# Patient Record
Sex: Female | Born: 1980 | Race: White | Hispanic: No | Marital: Married | Smoking: Former smoker
Health system: Southern US, Community
[De-identification: ages and names within clinical notes are randomized; demographics above are authoritative.]

## PROBLEM LIST (undated history)

## (undated) DIAGNOSIS — M1990 Unspecified osteoarthritis, unspecified site: Secondary | ICD-10-CM

## (undated) DIAGNOSIS — E8881 Metabolic syndrome: Secondary | ICD-10-CM

## (undated) DIAGNOSIS — R87619 Unspecified abnormal cytological findings in specimens from cervix uteri: Secondary | ICD-10-CM

## (undated) DIAGNOSIS — IMO0002 Reserved for concepts with insufficient information to code with codable children: Secondary | ICD-10-CM

## (undated) DIAGNOSIS — M797 Fibromyalgia: Secondary | ICD-10-CM

## (undated) DIAGNOSIS — E282 Polycystic ovarian syndrome: Secondary | ICD-10-CM

## (undated) DIAGNOSIS — D649 Anemia, unspecified: Secondary | ICD-10-CM

## (undated) DIAGNOSIS — M069 Rheumatoid arthritis, unspecified: Secondary | ICD-10-CM

## (undated) DIAGNOSIS — R51 Headache: Secondary | ICD-10-CM

## (undated) DIAGNOSIS — E039 Hypothyroidism, unspecified: Secondary | ICD-10-CM

## (undated) HISTORY — PX: WISDOM TOOTH EXTRACTION: SHX21

## (undated) HISTORY — DX: Fibromyalgia: M79.7

## (undated) HISTORY — DX: Rheumatoid arthritis, unspecified: M06.9

## (undated) HISTORY — PX: APPENDECTOMY: SHX54

## (undated) HISTORY — DX: Polycystic ovarian syndrome: E28.2

## (undated) HISTORY — DX: Unspecified abnormal cytological findings in specimens from cervix uteri: R87.619

## (undated) HISTORY — DX: Metabolic syndrome: E88.81

## (undated) HISTORY — DX: Reserved for concepts with insufficient information to code with codable children: IMO0002

## (undated) HISTORY — DX: Headache: R51

## (undated) HISTORY — DX: Anemia, unspecified: D64.9

## (undated) HISTORY — DX: Hypothyroidism, unspecified: E03.9

## (undated) HISTORY — DX: Unspecified osteoarthritis, unspecified site: M19.90

## (undated) HISTORY — PX: TONSILLECTOMY: SUR1361

---

## 2003-03-10 ENCOUNTER — Other Ambulatory Visit: Admission: RE | Admit: 2003-03-10 | Discharge: 2003-03-10 | Payer: Self-pay | Admitting: Gynecology

## 2004-08-05 ENCOUNTER — Emergency Department (HOSPITAL_COMMUNITY): Admission: EM | Admit: 2004-08-05 | Discharge: 2004-08-05 | Payer: Self-pay | Admitting: Emergency Medicine

## 2005-02-17 ENCOUNTER — Inpatient Hospital Stay (HOSPITAL_COMMUNITY): Admission: EM | Admit: 2005-02-17 | Discharge: 2005-02-19 | Payer: Self-pay | Admitting: Emergency Medicine

## 2005-02-17 ENCOUNTER — Encounter (INDEPENDENT_AMBULATORY_CARE_PROVIDER_SITE_OTHER): Payer: Self-pay | Admitting: General Surgery

## 2005-03-27 ENCOUNTER — Other Ambulatory Visit: Admission: RE | Admit: 2005-03-27 | Discharge: 2005-03-27 | Payer: Self-pay | Admitting: Gynecology

## 2005-11-09 ENCOUNTER — Other Ambulatory Visit: Admission: RE | Admit: 2005-11-09 | Discharge: 2005-11-09 | Payer: Self-pay | Admitting: Gynecology

## 2006-04-09 ENCOUNTER — Other Ambulatory Visit: Admission: RE | Admit: 2006-04-09 | Discharge: 2006-04-09 | Payer: Self-pay | Admitting: Gynecology

## 2006-08-07 ENCOUNTER — Emergency Department (HOSPITAL_COMMUNITY): Admission: EM | Admit: 2006-08-07 | Discharge: 2006-08-07 | Payer: Self-pay | Admitting: Emergency Medicine

## 2006-12-04 ENCOUNTER — Ambulatory Visit (HOSPITAL_COMMUNITY): Admission: RE | Admit: 2006-12-04 | Discharge: 2006-12-04 | Payer: Self-pay | Admitting: Family Medicine

## 2007-05-12 ENCOUNTER — Ambulatory Visit (HOSPITAL_COMMUNITY): Admission: RE | Admit: 2007-05-12 | Discharge: 2007-05-12 | Payer: Self-pay | Admitting: Orthopedic Surgery

## 2007-08-04 ENCOUNTER — Emergency Department (HOSPITAL_COMMUNITY): Admission: EM | Admit: 2007-08-04 | Discharge: 2007-08-04 | Payer: Self-pay | Admitting: Emergency Medicine

## 2008-01-23 DIAGNOSIS — E039 Hypothyroidism, unspecified: Secondary | ICD-10-CM

## 2008-01-23 HISTORY — DX: Hypothyroidism, unspecified: E03.9

## 2008-09-15 ENCOUNTER — Ambulatory Visit (HOSPITAL_COMMUNITY): Admission: RE | Admit: 2008-09-15 | Discharge: 2008-09-15 | Payer: Self-pay | Admitting: Family Medicine

## 2009-10-01 ENCOUNTER — Emergency Department (HOSPITAL_COMMUNITY)
Admission: EM | Admit: 2009-10-01 | Discharge: 2009-10-01 | Payer: Self-pay | Source: Home / Self Care | Admitting: Emergency Medicine

## 2009-12-14 ENCOUNTER — Observation Stay: Payer: Self-pay | Admitting: Obstetrics and Gynecology

## 2010-03-27 ENCOUNTER — Inpatient Hospital Stay (HOSPITAL_COMMUNITY): Admission: AD | Admit: 2010-03-27 | Payer: Self-pay | Admitting: Obstetrics and Gynecology

## 2010-04-06 ENCOUNTER — Inpatient Hospital Stay (HOSPITAL_COMMUNITY)
Admission: AD | Admit: 2010-04-06 | Discharge: 2010-04-08 | DRG: 775 | Disposition: A | Discharge status: Home / Self Care | Payer: PRIVATE HEALTH INSURANCE | Source: Ambulatory Visit | Attending: Obstetrics and Gynecology | Admitting: Obstetrics and Gynecology

## 2010-04-06 ENCOUNTER — Inpatient Hospital Stay (HOSPITAL_COMMUNITY)
Admission: AD | Admit: 2010-04-06 | Discharge: 2010-04-06 | Disposition: A | Discharge status: Home / Self Care | Payer: PRIVATE HEALTH INSURANCE | Source: Ambulatory Visit | Attending: Obstetrics and Gynecology | Admitting: Obstetrics and Gynecology

## 2010-04-06 DIAGNOSIS — O99284 Endocrine, nutritional and metabolic diseases complicating childbirth: Secondary | ICD-10-CM | POA: Diagnosis present

## 2010-04-06 DIAGNOSIS — E0590 Thyrotoxicosis, unspecified without thyrotoxic crisis or storm: Secondary | ICD-10-CM | POA: Diagnosis present

## 2010-04-06 DIAGNOSIS — E079 Disorder of thyroid, unspecified: Secondary | ICD-10-CM | POA: Diagnosis present

## 2010-04-06 DIAGNOSIS — O479 False labor, unspecified: Secondary | ICD-10-CM | POA: Insufficient documentation

## 2010-04-06 LAB — DIFFERENTIAL
Basophils Relative: 0 % (ref 0–1)
Monocytes Absolute: 0.5 10*3/uL (ref 0.1–1.0)
Neutrophils Relative %: 74 % (ref 43–77)

## 2010-04-06 LAB — BASIC METABOLIC PANEL
Calcium: 9.1 mg/dL (ref 8.4–10.5)
Chloride: 106 mEq/L (ref 96–112)
Creatinine, Ser: 0.54 mg/dL (ref 0.4–1.2)
Glucose, Bld: 90 mg/dL (ref 70–99)
Sodium: 135 mEq/L (ref 135–145)

## 2010-04-06 LAB — URINALYSIS, ROUTINE W REFLEX MICROSCOPIC
Hgb urine dipstick: NEGATIVE
Nitrite: NEGATIVE
Protein, ur: NEGATIVE mg/dL
Specific Gravity, Urine: 1.015 (ref 1.005–1.030)

## 2010-04-06 LAB — CBC
HCT: 34.2 % — ABNORMAL LOW (ref 36.0–46.0)
HCT: 38.4 % (ref 36.0–46.0)
Hemoglobin: 13 g/dL (ref 12.0–15.0)
MCH: 32.2 pg (ref 26.0–34.0)
MCHC: 34.2 g/dL (ref 30.0–36.0)
MCHC: 33.9 g/dL (ref 30.0–36.0)
MCV: 91.4 fL (ref 78.0–100.0)
Platelets: 184 10*3/uL (ref 150–400)
Platelets: 183 10*3/uL (ref 150–400)
RDW: 12.9 % (ref 11.5–15.5)
WBC: 6 10*3/uL (ref 4.0–10.5)

## 2010-04-07 LAB — RPR: RPR Ser Ql: NONREACTIVE

## 2010-04-08 LAB — CBC
MCV: 92.2 fL (ref 78.0–100.0)
Platelets: 171 10*3/uL (ref 150–400)
RBC: 3.7 MIL/uL — ABNORMAL LOW (ref 3.87–5.11)
WBC: 10.9 10*3/uL — ABNORMAL HIGH (ref 4.0–10.5)

## 2010-04-14 NOTE — Discharge Summary (Signed)
  NAME:  Elizabeth Moon, Elizabeth Moon NO.:  [id number]  MEDICAL RECORD NO.:  [id number]           PATIENT TYPE:  I  LOCATION:  9107                          FACILITY:  [facility]  PHYSICIAN:  Huel Cote, M.D. DATE OF BIRTH:  1980-07-10  DATE OF ADMISSION:  04/06/2010 DATE OF DISCHARGE:  04/08/2010                              DISCHARGE SUMMARY   DISCHARGE DIAGNOSES: 1. Term pregnancy at 40-6/7 weeks delivered. 2. Status post normal spontaneous vaginal delivery.  DISCHARGE MEDICATIONS: 1. Motrin 600 mg p.o. every 6 hours. 2. Percocet 1-2 tablets p.o. every 4 hours p.r.n.  DISCHARGE FOLLOWUP:  The patient is to follow up in the office in 6 weeks for her full postpartum exam.  HOSPITAL COURSE:  The patient is a 30 year old G1 P0 who was admitted at 40-6/7 weeks in early labor.  Pregnancy had been complicated by fibromyalgia, hyperthyroidism, polycystic ovary syndrome, all of which were well controlled in pregnancy.  PAST OBSTETRICAL HISTORY:  None.  PAST GYN HISTORY:  She had a remote history of abnormal Pap smear, which resolved on followup.  PAST MEDICAL HISTORY:  Significant for migraines, fibromyalgia, hypothyroidism, and insulin resistance with PCOS, arthritis, and bone spurs as well.  PAST SURGICAL HISTORY:  Significant for appendectomy, wisdom teeth extraction.  MEDICATIONS:  [place] Thyroid and metformin.  ALLERGIES:  LATEX sensitivity.  No known drug allergies.  She is married and has no tobacco, alcohol, or drug use.  Prenatal labs are as follows, A+ antibody negative, RPR nonreactive, rubella immune, hepatitis B surface antigen negative, HIV negative, GC negative, Chlamydia negative, genetic screens were declined, 1-hour Glucola was normal, and group B strep was negative.  The patient was admitted in labor and was found to be 4-5 cm dilated, 90% effaced, she had rupture of membranes performed, and Pitocin augmentation, and progressed well to complete  dilation after receiving an epidural anesthesia.  She then pushed well with a normal spontaneous vaginal delivery of a viable female infant, Apgars were 9 and 9, weight was 7 pounds 13 ounces.  The baby delivered through a nuchal cord and was LOP.  First-degree perineal laceration was repaired with 3-0 Vicryl Rapide and bilateral labial abrasions were hemostatic.  She was admitted for routine postpartum care.  On postpartum day #1, she was doing quite well and requested early discharge.  Her discharge hemoglobin was 11.4, and she was given instructions on pelvic rest and birth control and will follow up in the office in 6 weeks.     Huel Cote, M.D.     KR/MEDQ  D:  04/08/2010  T:  04/09/2010  Job:  [id number]  Electronically Signed by Huel Cote M.D. on 04/14/2010 09:07:23 AM

## 2010-06-09 NOTE — Consult Note (Signed)
Elizabeth Moon, Elizabeth Moon            ACCOUNT NO.:  [id number]   MEDICAL RECORD NO.:  [id number]          PATIENT TYPE:  INP   LOCATION:  A331                          FACILITY:  [facility]   PHYSICIAN:  Barbaraann Barthel, M.D. DATE OF BIRTH:  1981/01/06   DATE OF CONSULTATION:  02/17/2005  DATE OF DISCHARGE:                                   CONSULTATION   REASON FOR CONSULTATION:  Surgery was asked to see this 30 year old, white  female with right lower quadrant pain.   CHIEF COMPLAINT:  Right lower quadrant pain, nausea and vomiting.   HISTORY OF PRESENT ILLNESS:  The patient states that at approximately 2 a.m.  on February 16, 2005, she developed some sharp epigastric pain that later  localized to the right lower quadrant and was then followed with nausea and  vomiting.  She came to the emergency room around 11 p.m.  She was seen and  examined and a CT scan was performed showing the likelihood of appendicitis.  Surgery was consulted about 2:30 a.m. and we admitted her and hydrated her  with plans for surgery the following morning.   PHYSICAL EXAMINATION:  GENERAL:  This is a pleasant, 30 year old, white  female in no acute distress.  VITAL SIGNS:  Height 5 feet 7 inches, weight 158 pounds.  Temperature 98.1,  blood pressure 99/54, heart rate 64 per minute, respirations 18.  HEENT:  Head is normocephalic.  Extraocular movements intact.  Pupils equal  round and reactive to light and accommodation.  There is no conjunctive  pallor or scleral injection.  There are no bruits auscultated and no  cervical adenopathy.  CHEST:  Clear to posterior and anterior auscultation.  HEART:  Regular rate and rhythm.  BREASTS:  Axilla without masses.  ABDOMEN:  Tender in the right lower quadrant with guarding.  Bowel sounds  are a bit diminished.  There are no femoral or inguinal hernias appreciated.  RECTAL:  Guaiac negative stool.  PELVIC:  Deferred.  EXTREMITIES:  Within normal limits.   PAST  MEDICAL HISTORY:  History of migraines.   MEDICATIONS:  Medication for migraines and birth control pills.   ALLERGIES:  No known drug allergies.   PAST SURGICAL HISTORY:  None.   REVIEW OF SYSTEMS:  GASTROINTESTINAL:  Nausea and vomiting with right lower  quadrant pain.  No change in bowel habits.  No bright red rectal bleeding,  black tarry stools or unexplained weight loss.  No past history of  hepatitis, although she has a grandmother who has had liver cancer and a  mother who has been diagnosed with sclerosing cholangitis.  GENITOURINARY:  No dysuria, no history of kidney stones.  OB/GYN:  The patient's last  menstrual period was 2 months ago and she is a G0, P0, AB 0, cesarean 0  female.  She takes birth control pills.  NEUROLOGIC:  The patient has a  history of migraines.  CARDIORESPIRATORY:  Within normal limits.  The  patient has no cardiovascular problems.  She is a nonsmoker.  MUSCULOSKELETAL:  Within normal limits.   LABORATORY DATA AND X-RAY FINDINGS:  The patient has a  white count last  night of 17,000 with an H&H of 13.4 and 39.1 with 87 neutrophils noted.  Electrolytes show a mild hyponatremia at 131 and mild hypokalemia at 3.4,  BUN 10, creatinine 0.8.  Liver function studies are within normal limits.  Lipase not elevated.  Urine is hyperconcentrated, but otherwise negative for  blood, protein or glucose.  Her urine pregnancy test is negative.   CT scan was done last night.  The results show that change is suggestive of  acute appendicitis.  No abscess or perforation is appreciated.   IMPRESSION:  This is likely acute appendicitis.  She has been hydrated and  antibiotics have been initiated in the emergency room.   RECOMMENDATIONS:  We will plan for surgery as soon as possible.  We  discussed complications, not limited to, but including bleeding, infection  and appendiceal stump leakage and informed consent was obtained.      Barbaraann Barthel, M.D.   Electronically Signed     WB/MEDQ  D:  02/17/2005  T:  02/17/2005  Job:  [id number]

## 2010-06-09 NOTE — Discharge Summary (Signed)
Elizabeth Moon, Elizabeth Moon            ACCOUNT NO.:  [id number]   MEDICAL RECORD NO.:  [id number]          PATIENT TYPE:  INP   LOCATION:  A331                          FACILITY:  [facility]   PHYSICIAN:  Barbaraann Barthel, M.D. DATE OF BIRTH:  [birth year]   DATE OF ADMISSION:  02/16/2005  DATE OF DISCHARGE:  01/29/2007LH                                 DISCHARGE SUMMARY   DIAGNOSIS:  Acute appendicitis.   PROCEDURE:  On February 17, 2005, open appendectomy.   NOTE:  This is a 30 year old white female who was admitted for acute  appendicitis via the emergency room. She underwent an open appendectomy  uneventfully on February 17, 2005. She did well postoperatively. Her hospital  course was completely uneventful. Her diet and activity were advanced as  tolerated. She was discharged on the second postoperative day at which time  she was voiding well. She had no leg pain or shortness of breath, and her  wound was clean without any evidence of infection. She did quite well, and  she was discharged on the second postoperative day.   LABORATORY DATA:  Revealed acute appendicitis and site of endometriosis at  the distal tip with secondary fibrosis at that point noted. The rest of her  laboratory work, she was discharged with a white count 5.8. She had an  admitting white count of 17.4. She was discharged with a white count of 5.8  with H&H of 11.0 and 31.7. Her electrolytes were grossly within normal  limits. Her liver function studies were also not elevated. She had a  negative urine pregnancy test. A urinalysis was grossly within normal  limits. CT scan revealed evidence for acute appendicitis without  perforation. This was confirmed intraoperatively.   DISCHARGE INSTRUCTIONS:  Discharge instructions include that she is excused  from work. She is discharged on a soft diet. She is to increase her activity  as tolerated. She is permitted to shower, permitted to walk up and down the  stairs. She is to  refrain from doing any heavy lifting, sexual activity or  driving for immediate postoperative period.   She is told to clean her wound with alcohol three times a day. She is given  a prescription for Darvocet-N 100 one tablet every 4 hours as needed for  pain, and she is to told to take no Ex-Lax or other harsh cathartics in  postoperative period. I made arrangements follow-up with her on Thursday,  February 1 at 2 o'clock p.m., and I have told her to contact me or come the  emergency room should there be any acute changes.   Please add on the dictation that this is the second discharge summary  dictated on this patient.     Barbaraann Barthel, M.D.  Electronically Signed    WB/MEDQ  D:  03/05/2005  T:  03/06/2005  Job:  [id number]

## 2010-06-09 NOTE — Op Note (Signed)
NAMEYUDIT, MODESITT            ACCOUNT NO.:  [id number]   MEDICAL RECORD NO.:  [id number]          PATIENT TYPE:  INP   LOCATION:  A331                          FACILITY:  [facility]   PHYSICIAN:  Barbaraann Barthel, M.D. DATE OF BIRTH:  August 15, 1980   DATE OF PROCEDURE:  03/05/2005  DATE OF DISCHARGE:  02/19/2005                                 OPERATIVE REPORT   Please add that this is a second operative note dictated as well as a second  discharge summary dictated. I will redictate an operative note and a  discharge summary as apparently they were lost in the system.   SURGEON:  Dr. Malvin Johns.   PREOPERATIVE DIAGNOSIS:  Acute appendicitis.   PROCEDURE:  Open appendectomy.   SPECIMEN:  Appendix.   NOTE:  This is a 30 year old female who was admitted early in the morning of  February 17, 2005 for acute appendicitis as seen on a CT scan. She was taken  to surgery after hydration and antibiotics were initiated at which time an  acute, suppurative, nonperforated appendix was found. An open appendectomy  was performed uneventfully.   GROSS OPERATIVE FINDINGS:  Those consistent with acute, supportive,  nonperforated appendix.   TECHNIQUE:  The patient was placed in supine position. After the adequate  administration of general anesthesia via endotracheal intubation, entire was  prepped Betadine solution and draped in usual manner. A Foley catheter was  placed aseptically. A low transverse incision was carried out through skin,  subcutaneous tissue through the peritoneal cavity, and the right lower  quadrant was explored. Normal terminal ileum. No other abnormalities were  encountered. The appendix was acutely inflamed. This was delivered into the  wound. The mesoappendix was ligated with 2-0 silk and amputated with a TA-30  stapling device. The right lower quadrant was then irrigated. We changed  gloves. The peritoneum was closed with a running 0 Polysorb suture, and  figure-of-eight 0  Polysorb sutures were used to close the fascia. Subcu was  irrigated. Skin was closed with a stapling device. Prior to closure, all  sponge, needle and instrument counts were found to be correct. Estimated  blood loss was minimal. The patient received 1,300 cc of crystalloids  intraoperatively. No drains were placed. There were no complications.      Barbaraann Barthel, M.D.  Electronically Signed     WB/MEDQ  D:  03/05/2005  T:  03/06/2005  Job:  [id number]

## 2010-07-28 ENCOUNTER — Other Ambulatory Visit (HOSPITAL_COMMUNITY): Payer: Self-pay | Admitting: Orthopedic Surgery

## 2010-07-28 DIAGNOSIS — M542 Cervicalgia: Secondary | ICD-10-CM

## 2010-08-01 ENCOUNTER — Other Ambulatory Visit (HOSPITAL_COMMUNITY): Payer: PRIVATE HEALTH INSURANCE

## 2010-08-02 ENCOUNTER — Ambulatory Visit: Payer: Self-pay | Admitting: Orthopedic Surgery

## 2012-01-23 NOTE — L&D Delivery Note (Signed)
Delivery Note At 8:36 PM a healthy female was delivered via Vaginal, Spontaneous Delivery (Presentation: Right Occiput Posterior).  APGAR:9 ,9 ; weight pending.   Placenta status: Intact, Spontaneous.  Cord: 3 vessels with the following complications: None.   Anesthesia: Epidural  Episiotomy: None Lacerations: none Suture Repair:n/a Est. Blood Loss (mL): 300cc  Mom to postpartum.  Baby to stay with mom. Oliver Pila 09/11/2012, 8:55 PM

## 2012-03-03 LAB — OB RESULTS CONSOLE RUBELLA ANTIBODY, IGM: Rubella: IMMUNE

## 2012-03-03 LAB — OB RESULTS CONSOLE HIV ANTIBODY (ROUTINE TESTING): HIV: NONREACTIVE

## 2012-03-03 LAB — OB RESULTS CONSOLE GC/CHLAMYDIA: Chlamydia: NEGATIVE

## 2012-03-03 LAB — OB RESULTS CONSOLE ABO/RH

## 2012-03-03 LAB — OB RESULTS CONSOLE ANTIBODY SCREEN: Antibody Screen: NEGATIVE

## 2012-03-03 LAB — OB RESULTS CONSOLE HEPATITIS B SURFACE ANTIGEN: Hepatitis B Surface Ag: NEGATIVE

## 2012-03-03 LAB — OB RESULTS CONSOLE RPR: RPR: NONREACTIVE

## 2012-09-02 ENCOUNTER — Telehealth (HOSPITAL_COMMUNITY): Payer: Self-pay | Admitting: *Deleted

## 2012-09-02 ENCOUNTER — Encounter (HOSPITAL_COMMUNITY): Payer: Self-pay | Admitting: *Deleted

## 2012-09-02 NOTE — Telephone Encounter (Signed)
Preadmission screen  

## 2012-09-10 NOTE — H&P (Signed)
Elizabeth Moon is a 32 y.o. female presenting for G3P1011 at 39+ weeks (EDD 09/16/12 by 8 week [place]) presents for IOL at term.  Prenatal care relatively uncomplicated.  Pt has a history of PCOS and insulin resistance and is managed with metformin and no issues with gestational diabetes. Hypothyroid but levels are stable.  Maternal Medical History:  Contractions: Frequency: irregular.   Perceived severity is mild.    Fetal activity: Perceived fetal activity is normal.    Prenatal Complications - Diabetes: none.    OB History   Grav Para Term Preterm Abortions TAB SAB Ect Mult Living   3 1 1  1  1   1     NSVD 2012 7#13oz SAB x 1  Past Medical History  Diagnosis Date  . Abnormal Pap smear   . Anemia   . Arthritis   . Headache(784.0)   . Fibromyalgia   . Hypothyroidism   . PCOS (polycystic ovarian syndrome)   . Insulin resistance    Past Surgical History  Procedure Laterality Date  . Wisdom tooth extraction    . Appendectomy     Family History: family history includes Alzheimer's disease in her maternal grandmother; Cancer in her maternal grandmother; Depression in her maternal aunt, maternal grandmother, and mother; Diabetes in her paternal grandfather and paternal grandmother; Stroke in her maternal grandmother and paternal grandfather. Social History:  reports that she has never smoked. She has never used smokeless tobacco. She reports that she does not drink alcohol or use illicit drugs.   Prenatal Transfer Tool  Maternal Diabetes: No Genetic Screening: Declined Maternal Ultrasounds/Referrals: Normal Fetal Ultrasounds or other Referrals:  None Maternal Substance Abuse:  No Significant Maternal Medications:  Meds include: Other: Metformin Significant Maternal Lab Results:  None Other Comments:  None  ROS    Last menstrual period 12/04/2011. Maternal Exam:  Uterine Assessment: Contraction strength is mild.  Contraction frequency is irregular.   Abdomen: Patient  reports no abdominal tenderness. Fetal presentation: vertex  Introitus: Normal vulva. Normal vagina.    Physical Exam  Constitutional: She is oriented to person, place, and time. She appears well-developed and well-nourished.  Cardiovascular: Normal rate and regular rhythm.   Respiratory: Effort normal and breath sounds normal.  GI: Soft. Bowel sounds are normal.  Genitourinary: Vagina normal and uterus normal.  Neurological: She is alert and oriented to person, place, and time.  Psychiatric: She has a normal mood and affect.    Prenatal labs: ABO, Rh: A/Positive/-- (02/10 0000) Antibody: Negative (02/10 0000) Rubella: Immune (02/10 0000) RPR: Nonreactive (02/10 0000)  HBsAg: Negative (02/10 0000)  HIV: Non-reactive (02/10 0000)  GBS:   negative One hour GTT 95 Declined genetic screen GC/Chlam neg  Assessment/Plan: Pt for IOL at term.  Pitocin and SROM planned.   Oliver Pila 09/10/2012, 11:15 AM

## 2012-09-11 ENCOUNTER — Encounter (HOSPITAL_COMMUNITY): Payer: Self-pay | Admitting: Anesthesiology

## 2012-09-11 ENCOUNTER — Inpatient Hospital Stay (HOSPITAL_COMMUNITY): Payer: 59 | Admitting: Anesthesiology

## 2012-09-11 ENCOUNTER — Inpatient Hospital Stay (HOSPITAL_COMMUNITY)
Admission: RE | Admit: 2012-09-11 | Discharge: 2012-09-13 | DRG: 775 | Disposition: A | Discharge status: Home / Self Care | Payer: 59 | Source: Ambulatory Visit | Attending: Obstetrics and Gynecology | Admitting: Obstetrics and Gynecology

## 2012-09-11 ENCOUNTER — Encounter (HOSPITAL_COMMUNITY): Payer: Self-pay

## 2012-09-11 DIAGNOSIS — E079 Disorder of thyroid, unspecified: Principal | ICD-10-CM | POA: Diagnosis present

## 2012-09-11 DIAGNOSIS — E039 Hypothyroidism, unspecified: Secondary | ICD-10-CM | POA: Diagnosis present

## 2012-09-11 LAB — CBC
HCT: 35.1 % — ABNORMAL LOW (ref 36.0–46.0)
Hemoglobin: 12.1 g/dL (ref 12.0–15.0)
MCV: 90.5 fL (ref 78.0–100.0)
Platelets: 163 10*3/uL (ref 150–400)
RBC: 3.88 MIL/uL (ref 3.87–5.11)
WBC: 7.3 10*3/uL (ref 4.0–10.5)

## 2012-09-11 MED ORDER — IBUPROFEN 600 MG PO TABS
600.0000 mg | ORAL_TABLET | Freq: Four times a day (QID) | ORAL | Status: DC
  Administered 2012-09-12 – 2012-09-13 (×6): 600 mg via ORAL
  Filled 2012-09-11 (×5): qty 1

## 2012-09-11 MED ORDER — SENNOSIDES-DOCUSATE SODIUM 8.6-50 MG PO TABS
2.0000 | ORAL_TABLET | Freq: Every day | ORAL | Status: DC
  Administered 2012-09-12: 2 via ORAL

## 2012-09-11 MED ORDER — LIDOCAINE HCL (PF) 1 % IJ SOLN
INTRAMUSCULAR | Status: DC | PRN
  Administered 2012-09-11 (×2): 9 mL

## 2012-09-11 MED ORDER — OXYCODONE-ACETAMINOPHEN 5-325 MG PO TABS: 1.0000 | ORAL_TABLET | ORAL | Status: DC | PRN

## 2012-09-11 MED ORDER — LACTATED RINGERS IV SOLN: 500.0000 mL | INTRAVENOUS | Status: DC | PRN

## 2012-09-11 MED ORDER — DIPHENHYDRAMINE HCL 50 MG/ML IJ SOLN: 12.5000 mg | INTRAMUSCULAR | Status: DC | PRN

## 2012-09-11 MED ORDER — EPHEDRINE 5 MG/ML INJ
10.0000 mg | INTRAVENOUS | Status: DC | PRN
  Filled 2012-09-11: qty 2

## 2012-09-11 MED ORDER — WITCH HAZEL-GLYCERIN EX PADS: 1.0000 "application " | MEDICATED_PAD | CUTANEOUS | Status: DC | PRN

## 2012-09-11 MED ORDER — ONDANSETRON HCL 4 MG/2ML IJ SOLN
4.0000 mg | Freq: Four times a day (QID) | INTRAMUSCULAR | Status: DC | PRN
  Administered 2012-09-11: 4 mg via INTRAVENOUS
  Filled 2012-09-11: qty 2

## 2012-09-11 MED ORDER — ZOLPIDEM TARTRATE 5 MG PO TABS: 5.0000 mg | ORAL_TABLET | Freq: Every evening | ORAL | Status: DC | PRN

## 2012-09-11 MED ORDER — FENTANYL 2.5 MCG/ML BUPIVACAINE 1/10 % EPIDURAL INFUSION (WH - ANES)
INTRAMUSCULAR | Status: DC | PRN
  Administered 2012-09-11: 14 mL/h via EPIDURAL

## 2012-09-11 MED ORDER — BENZOCAINE-MENTHOL 20-0.5 % EX AERO
1.0000 "application " | INHALATION_SPRAY | CUTANEOUS | Status: DC | PRN
  Administered 2012-09-12: 1 via TOPICAL
  Filled 2012-09-11: qty 56

## 2012-09-11 MED ORDER — PHENYLEPHRINE 40 MCG/ML (10ML) SYRINGE FOR IV PUSH (FOR BLOOD PRESSURE SUPPORT)
80.0000 ug | PREFILLED_SYRINGE | INTRAVENOUS | Status: DC | PRN
  Filled 2012-09-11: qty 5
  Filled 2012-09-11: qty 2

## 2012-09-11 MED ORDER — LACTATED RINGERS IV SOLN
INTRAVENOUS | Status: DC
  Administered 2012-09-11 (×2): 1000 mL via INTRAVENOUS

## 2012-09-11 MED ORDER — LANOLIN HYDROUS EX OINT: TOPICAL_OINTMENT | CUTANEOUS | Status: DC | PRN

## 2012-09-11 MED ORDER — ACETAMINOPHEN 325 MG PO TABS
650.0000 mg | ORAL_TABLET | ORAL | Status: DC | PRN
  Administered 2012-09-11: 650 mg via ORAL
  Filled 2012-09-11: qty 2

## 2012-09-11 MED ORDER — OXYTOCIN 40 UNITS IN LACTATED RINGERS INFUSION - SIMPLE MED: 62.5000 mL/h | INTRAVENOUS | Status: DC

## 2012-09-11 MED ORDER — TERBUTALINE SULFATE 1 MG/ML IJ SOLN: 0.2500 mg | Freq: Once | INTRAMUSCULAR | Status: DC | PRN

## 2012-09-11 MED ORDER — IBUPROFEN 600 MG PO TABS: 600.0000 mg | ORAL_TABLET | Freq: Four times a day (QID) | ORAL | Status: DC | PRN

## 2012-09-11 MED ORDER — LACTATED RINGERS IV SOLN: 500.0000 mL | Freq: Once | INTRAVENOUS | Status: DC

## 2012-09-11 MED ORDER — EPHEDRINE 5 MG/ML INJ
10.0000 mg | INTRAVENOUS | Status: DC | PRN
  Filled 2012-09-11: qty 4
  Filled 2012-09-11: qty 2

## 2012-09-11 MED ORDER — TETANUS-DIPHTH-ACELL PERTUSSIS 5-2.5-18.5 LF-MCG/0.5 IM SUSP: 0.5000 mL | Freq: Once | INTRAMUSCULAR | Status: DC

## 2012-09-11 MED ORDER — OXYCODONE-ACETAMINOPHEN 5-325 MG PO TABS
1.0000 | ORAL_TABLET | ORAL | Status: DC | PRN
  Administered 2012-09-12 (×2): 1 via ORAL
  Filled 2012-09-11 (×2): qty 1

## 2012-09-11 MED ORDER — ONDANSETRON HCL 4 MG PO TABS: 4.0000 mg | ORAL_TABLET | ORAL | Status: DC | PRN

## 2012-09-11 MED ORDER — DIPHENHYDRAMINE HCL 25 MG PO CAPS: 25.0000 mg | ORAL_CAPSULE | Freq: Four times a day (QID) | ORAL | Status: DC | PRN

## 2012-09-11 MED ORDER — ONDANSETRON HCL 4 MG/2ML IJ SOLN: 4.0000 mg | INTRAMUSCULAR | Status: DC | PRN

## 2012-09-11 MED ORDER — LIDOCAINE HCL (PF) 1 % IJ SOLN
30.0000 mL | INTRAMUSCULAR | Status: DC | PRN
  Filled 2012-09-11: qty 30

## 2012-09-11 MED ORDER — DIBUCAINE 1 % RE OINT: 1.0000 "application " | TOPICAL_OINTMENT | RECTAL | Status: DC | PRN

## 2012-09-11 MED ORDER — FAMOTIDINE 20 MG PO TABS
20.0000 mg | ORAL_TABLET | Freq: Two times a day (BID) | ORAL | Status: DC
  Administered 2012-09-12: 20 mg via ORAL
  Filled 2012-09-11 (×3): qty 1

## 2012-09-11 MED ORDER — OXYTOCIN BOLUS FROM INFUSION
500.0000 mL | INTRAVENOUS | Status: DC
  Administered 2012-09-11: 500 mL via INTRAVENOUS

## 2012-09-11 MED ORDER — CITRIC ACID-SODIUM CITRATE 334-500 MG/5ML PO SOLN: 30.0000 mL | ORAL | Status: DC | PRN

## 2012-09-11 MED ORDER — OXYTOCIN 40 UNITS IN LACTATED RINGERS INFUSION - SIMPLE MED
1.0000 m[IU]/min | INTRAVENOUS | Status: DC
  Administered 2012-09-11: 8 m[IU]/min via INTRAVENOUS
  Administered 2012-09-11: 2 m[IU]/min via INTRAVENOUS
  Administered 2012-09-11: 10 m[IU]/min via INTRAVENOUS
  Administered 2012-09-11: 6 m[IU]/min via INTRAVENOUS
  Administered 2012-09-11: 4 m[IU]/min via INTRAVENOUS
  Filled 2012-09-11: qty 1000

## 2012-09-11 MED ORDER — PHENYLEPHRINE 40 MCG/ML (10ML) SYRINGE FOR IV PUSH (FOR BLOOD PRESSURE SUPPORT)
80.0000 ug | PREFILLED_SYRINGE | INTRAVENOUS | Status: DC | PRN
  Filled 2012-09-11: qty 2

## 2012-09-11 MED ORDER — PSEUDOEPHEDRINE HCL 30 MG PO TABS
30.0000 mg | ORAL_TABLET | Freq: Once | ORAL | Status: AC | PRN
  Administered 2012-09-11: 30 mg via ORAL
  Filled 2012-09-11: qty 1

## 2012-09-11 MED ORDER — FENTANYL 2.5 MCG/ML BUPIVACAINE 1/10 % EPIDURAL INFUSION (WH - ANES)
14.0000 mL/h | INTRAMUSCULAR | Status: DC | PRN
  Administered 2012-09-11: 14 mL/h via EPIDURAL
  Filled 2012-09-11 (×2): qty 125

## 2012-09-11 MED ORDER — SIMETHICONE 80 MG PO CHEW: 80.0000 mg | CHEWABLE_TABLET | ORAL | Status: DC | PRN

## 2012-09-11 MED ORDER — PRENATAL MULTIVITAMIN CH
1.0000 | ORAL_TABLET | Freq: Every day | ORAL | Status: DC
  Administered 2012-09-12: 1 via ORAL
  Filled 2012-09-11: qty 1

## 2012-09-11 MED ORDER — BUTORPHANOL TARTRATE 1 MG/ML IJ SOLN: 1.0000 mg | INTRAMUSCULAR | Status: DC | PRN

## 2012-09-11 NOTE — Anesthesia Procedure Notes (Signed)
Epidural Patient location during procedure: OB Start time: 09/11/2012 1:04 PM End time: 09/11/2012 1:08 PM  Staffing Anesthesiologist: Sandrea Hughs Performed by: anesthesiologist   Preanesthetic Checklist Completed: patient identified, surgical consent, pre-op evaluation, timeout performed, IV checked, risks and benefits discussed and monitors and equipment checked  Epidural Patient position: sitting Prep: site prepped and draped and DuraPrep Patient monitoring: continuous pulse ox and blood pressure Approach: midline Injection technique: LOR air  Needle:  Needle type: Tuohy  Needle gauge: 17 G Needle length: 9 cm and 9 Needle insertion depth: 6 cm Catheter type: closed end flexible Catheter size: 19 Gauge Catheter at skin depth: 11 cm Test dose: negative and Other  Assessment Sensory level: T9 Events: blood not aspirated, injection not painful, no injection resistance, negative IV test and no paresthesia  Additional Notes Reason for block:procedure for pain

## 2012-09-11 NOTE — Progress Notes (Signed)
Patient ID: Elizabeth Moon, female   DOB: 1980-01-29, 32 y.o.   MRN: [id number] Pt here and begun on her pitocin, feeling mild cramping FHR category 1 Cervix 50/1-2/-2  AROM clear Follow progress Epidural prn

## 2012-09-11 NOTE — Progress Notes (Signed)
Patient ID: Elizabeth Moon, female   DOB: 1980/09/09, 32 y.o.   MRN: [id number] Pt got uncomfortable and received epidural FHR category 1 Cervix 70/3/-2   IUPC placed to adjust pitocin

## 2012-09-11 NOTE — Anesthesia Preprocedure Evaluation (Signed)
Anesthesia Evaluation  Patient identified by MRN, date of birth, ID band Patient awake    Reviewed: Allergy & Precautions, H&P , NPO status , Patient's Chart, lab work & pertinent test results  Airway Mallampati: II TM Distance: >3 FB Neck ROM: full    Dental no notable dental hx.    Pulmonary neg pulmonary ROS,    Pulmonary exam normal       Cardiovascular negative cardio ROS      Neuro/Psych negative psych ROS   GI/Hepatic negative GI ROS, Neg liver ROS,   Endo/Other    Renal/GU negative Renal ROS     Musculoskeletal  (+) Fibromyalgia -  Abdominal Normal abdominal exam  (+)   Peds  Hematology negative hematology ROS (+)   Anesthesia Other Findings   Reproductive/Obstetrics (+) Pregnancy                           Anesthesia Physical Anesthesia Plan  ASA: II  Anesthesia Plan: Epidural   Post-op Pain Management:    Induction:   Airway Management Planned:   Additional Equipment:   Intra-op Plan:   Post-operative Plan:   Informed Consent: I have reviewed the patients History and Physical, chart, labs and discussed the procedure including the risks, benefits and alternatives for the proposed anesthesia with the patient or authorized representative who has indicated his/her understanding and acceptance.     Plan Discussed with:   Anesthesia Plan Comments:         Anesthesia Quick Evaluation

## 2012-09-11 NOTE — Progress Notes (Signed)
   Subjective: Feeling some pressure  Objective: BP 99/50  Pulse 100  Temp(Src) 97.7 F (36.5 C) (Oral)  Resp 18  Ht 5\' 7"  (1.702 m)  Wt 97.07 kg (214 lb)  BMI 33.51 kg/m2  SpO2 99%  LMP 12/04/2011      FHT:  Baseline 115 with early/variable decels  UC:  q 2-3 min SVE:   C/9/-1 Labs: Lab Results  Component Value Date   WBC 7.3 09/11/2012   HGB 12.1 09/11/2012   HCT 35.1* 09/11/2012   MCV 90.5 09/11/2012   PLT 163 09/11/2012    Assessment / Plan: Making progress Argie Lober W 09/11/2012, 7:02 PM

## 2012-09-12 LAB — CBC
MCH: 31.6 pg (ref 26.0–34.0)
MCV: 92.6 fL (ref 78.0–100.0)
Platelets: 154 10*3/uL (ref 150–400)
RDW: 12.3 % (ref 11.5–15.5)

## 2012-09-12 MED ORDER — PSEUDOEPHEDRINE HCL 30 MG PO TABS
30.0000 mg | ORAL_TABLET | ORAL | Status: DC | PRN
  Administered 2012-09-12: 30 mg via ORAL
  Filled 2012-09-12: qty 1

## 2012-09-12 NOTE — Progress Notes (Signed)
Post Partum Day 1 Subjective: no complaints, up ad lib and tolerating PO  Objective: Blood pressure 106/64, pulse 63, temperature 98.2 F (36.8 C), temperature source Oral, resp. rate 20, height 5\' 7"  (1.702 m), weight 97.07 kg (214 lb), last menstrual period 12/04/2011, SpO2 99.00%, unknown if currently breastfeeding.  Physical Exam:  General: alert and cooperative Lochia: appropriate Uterine Fundus: firm    Recent Labs  09/11/12 0750 09/12/12 0555  HGB 12.1 11.9*  HCT 35.1* 34.8*    Assessment/Plan: Plan for discharge tomorrow   LOS: 1 day   Ariahna Smiddy W 09/12/2012, 9:42 AM

## 2012-09-13 NOTE — Progress Notes (Signed)
PPD #2 No problems Afeb, VSS D/c home 

## 2012-09-13 NOTE — Discharge Summary (Signed)
Obstetric Discharge Summary Reason for Admission: induction of labor Prenatal Procedures: none Intrapartum Procedures: spontaneous vaginal delivery Postpartum Procedures: none Complications-Operative and Postpartum: none Hemoglobin  Date Value Range Status  09/12/2012 11.9* 12.0 - 15.0 g/dL Final     HCT  Date Value Range Status  09/12/2012 34.8* 36.0 - 46.0 % Final    Physical Exam:  General: alert Lochia: appropriate Uterine Fundus: firm  Discharge Diagnoses: Term Pregnancy-delivered  Discharge Information: Date: 09/13/2012 Activity: pelvic rest Diet: routine Medications: Ibuprofen Condition: stable Instructions: refer to practice specific booklet Discharge to: home Follow-up Information   Follow up with Oliver Pila, MD. Schedule an appointment as soon as possible for a visit in 6 weeks. (postpartum)    Specialty:  Obstetrics and Gynecology   Contact information:   [address], [address] [place] [address] [phone]       Newborn Data: Live born female  Birth Weight: 8 lb 9 oz (3885 g) APGAR: 8, 9  Home with mother.  Elizabeth Moon D 09/13/2012, 9:54 AM

## 2012-09-16 ENCOUNTER — Inpatient Hospital Stay (HOSPITAL_COMMUNITY): Admission: AD | Admit: 2012-09-16 | Payer: 59 | Source: Ambulatory Visit | Admitting: Obstetrics and Gynecology

## 2013-11-23 ENCOUNTER — Encounter (HOSPITAL_COMMUNITY): Payer: Self-pay

## 2015-01-23 NOTE — L&D Delivery Note (Signed)
Delivery Note Pt progressed rapidly to complete dilation and pushed great.  At 1:55 AM a healthy female was delivered via Vaginal, Spontaneous Delivery (Presentation: OA  ).  APGAR:8 , 9; weight pending .   Placenta status: delivered spontaneously .  Cord:  with the following complications: tight nuchal x 1 delivered through.    Anesthesia: epidural  Episiotomy: None Lacerations: None Suture Repair: n/a Est. Blood Loss (mL):  175ml  Mom to postpartum.  Baby to Couplet care / Skin to Skin. D/w pt circumcision and they desire in the hospital  Toran Murch W 12/18/2015, 2:21 AM

## 2015-02-11 DIAGNOSIS — Z30432 Encounter for removal of intrauterine contraceptive device: Secondary | ICD-10-CM | POA: Diagnosis not present

## 2015-05-12 DIAGNOSIS — Z3201 Encounter for pregnancy test, result positive: Secondary | ICD-10-CM | POA: Diagnosis not present

## 2015-05-12 DIAGNOSIS — N911 Secondary amenorrhea: Secondary | ICD-10-CM | POA: Diagnosis not present

## 2015-05-26 DIAGNOSIS — Z1151 Encounter for screening for human papillomavirus (HPV): Secondary | ICD-10-CM | POA: Diagnosis not present

## 2015-05-26 DIAGNOSIS — O26841 Uterine size-date discrepancy, first trimester: Secondary | ICD-10-CM | POA: Diagnosis not present

## 2015-05-26 DIAGNOSIS — O3680X0 Pregnancy with inconclusive fetal viability, not applicable or unspecified: Secondary | ICD-10-CM | POA: Diagnosis not present

## 2015-05-26 DIAGNOSIS — Z124 Encounter for screening for malignant neoplasm of cervix: Secondary | ICD-10-CM | POA: Diagnosis not present

## 2015-05-26 DIAGNOSIS — Z3A10 10 weeks gestation of pregnancy: Secondary | ICD-10-CM | POA: Diagnosis not present

## 2015-05-26 DIAGNOSIS — Z36 Encounter for antenatal screening of mother: Secondary | ICD-10-CM | POA: Diagnosis not present

## 2015-05-26 DIAGNOSIS — Z3481 Encounter for supervision of other normal pregnancy, first trimester: Secondary | ICD-10-CM | POA: Diagnosis not present

## 2015-05-26 LAB — OB RESULTS CONSOLE GC/CHLAMYDIA
Chlamydia: NEGATIVE
GC PROBE AMP, GENITAL: NEGATIVE

## 2015-05-26 LAB — OB RESULTS CONSOLE RPR: RPR: NONREACTIVE

## 2015-05-26 LAB — OB RESULTS CONSOLE ABO/RH: RH Type: POSITIVE

## 2015-05-26 LAB — OB RESULTS CONSOLE RUBELLA ANTIBODY, IGM: RUBELLA: IMMUNE

## 2015-05-26 LAB — OB RESULTS CONSOLE ANTIBODY SCREEN: ANTIBODY SCREEN: NEGATIVE

## 2015-05-26 LAB — OB RESULTS CONSOLE HEPATITIS B SURFACE ANTIGEN: Hepatitis B Surface Ag: NEGATIVE

## 2015-05-26 LAB — OB RESULTS CONSOLE HIV ANTIBODY (ROUTINE TESTING): HIV: NONREACTIVE

## 2015-05-27 DIAGNOSIS — R875 Abnormal microbiological findings in specimens from female genital organs: Secondary | ICD-10-CM | POA: Diagnosis not present

## 2015-05-27 DIAGNOSIS — Z124 Encounter for screening for malignant neoplasm of cervix: Secondary | ICD-10-CM | POA: Diagnosis not present

## 2015-05-27 DIAGNOSIS — Z36 Encounter for antenatal screening of mother: Secondary | ICD-10-CM | POA: Diagnosis not present

## 2015-06-27 ENCOUNTER — Ambulatory Visit: Payer: Self-pay | Admitting: Physician Assistant

## 2015-06-27 ENCOUNTER — Encounter: Payer: Self-pay | Admitting: Physician Assistant

## 2015-06-27 VITALS — BP 110/70 | HR 76 | Temp 98.3°F

## 2015-06-27 DIAGNOSIS — J00 Acute nasopharyngitis [common cold]: Secondary | ICD-10-CM

## 2015-06-27 NOTE — Progress Notes (Signed)
S: C/o runny nose and congestion for 3 days, no fever, chills, cp/sob, v/d; mucus is clear throughout the day, cough is sporadic, is 15.5 weeks preg  Using otc meds: none  O: PE: vitals wnl, nad, perrl eomi, normocephalic, tms dull, nasal mucosa red and swollen, throat injected, neck supple no lymph, lungs c t a, cv rrr, neuro intact  A:  Acute viral uri   P: drink fluids, continue regular meds , use otc meds of choice, return if not improving in 5 days, return earlier if worsening , recommend claritin or robitussin

## 2015-07-08 DIAGNOSIS — E282 Polycystic ovarian syndrome: Secondary | ICD-10-CM | POA: Diagnosis not present

## 2015-08-02 DIAGNOSIS — O99282 Endocrine, nutritional and metabolic diseases complicating pregnancy, second trimester: Secondary | ICD-10-CM | POA: Diagnosis not present

## 2015-08-02 DIAGNOSIS — Z36 Encounter for antenatal screening of mother: Secondary | ICD-10-CM | POA: Diagnosis not present

## 2015-08-02 DIAGNOSIS — Z3A19 19 weeks gestation of pregnancy: Secondary | ICD-10-CM | POA: Diagnosis not present

## 2015-09-29 DIAGNOSIS — Z36 Encounter for antenatal screening of mother: Secondary | ICD-10-CM | POA: Diagnosis not present

## 2015-09-29 DIAGNOSIS — O365930 Maternal care for other known or suspected poor fetal growth, third trimester, not applicable or unspecified: Secondary | ICD-10-CM | POA: Diagnosis not present

## 2015-09-29 DIAGNOSIS — Z3A28 28 weeks gestation of pregnancy: Secondary | ICD-10-CM | POA: Diagnosis not present

## 2015-09-29 DIAGNOSIS — Z3483 Encounter for supervision of other normal pregnancy, third trimester: Secondary | ICD-10-CM | POA: Diagnosis not present

## 2015-11-23 LAB — OB RESULTS CONSOLE GBS: GBS: POSITIVE

## 2015-12-09 ENCOUNTER — Encounter (HOSPITAL_COMMUNITY): Payer: Self-pay | Admitting: *Deleted

## 2015-12-09 ENCOUNTER — Telehealth (HOSPITAL_COMMUNITY): Payer: Self-pay | Admitting: *Deleted

## 2015-12-09 NOTE — Telephone Encounter (Signed)
Preadmission screen  

## 2015-12-16 NOTE — H&P (Signed)
Elizabeth Moon is a 35 y.o. female [id number] at 6139 3/7 weeks presenting for IOL at term with favorable cervix.  Prenatal care complicated by +GBS.  H/o hypothyroidism, but normal labs this pregnancy and not requiring meds.   H/o PCOS and conceived other pregnancies on metformin and stayed on it last pregnancy, but this one spontaneously.  Early glucola and glucola were WNL.    OB History    Gravida Para Term Preterm AB Living   4 2 2   1 2    SAB TAB Ectopic Multiple Live Births   1       2    NSVD x 2 (7#13oz, 8#8oz) SAB x 1  Past Medical History:  Diagnosis Date  . Abnormal Pap smear   . Anemia   . Arthritis   . Fibromyalgia   . Fibromyalgia   . Headache(784.0)   . Hypothyroidism   . Insulin resistance   . PCOS (polycystic ovarian syndrome)   . Rheumatoid arthritis [facility](HCC)    Past Surgical History:  Procedure Laterality Date  . APPENDECTOMY    . TONSILLECTOMY    . WISDOM TOOTH EXTRACTION     Family History: family history includes Alzheimer's disease in her maternal grandmother; Cancer in her maternal grandmother; Depression in her maternal aunt, maternal grandmother, and mother; Diabetes in her paternal grandfather and paternal grandmother; Stroke in her maternal grandmother and paternal grandfather. Social History:  reports that she has quit smoking. She has never used smokeless tobacco. She reports that she does not drink alcohol or use drugs.     Maternal Diabetes: No Genetic Screening: Declined Maternal Ultrasounds/Referrals: Normal Fetal Ultrasounds or other Referrals:  None Maternal Substance Abuse:  No Significant Maternal Medications:  None Significant Maternal Lab Results:  Lab values include: Group B Strep positive Other Comments:  None  Review of Systems  Eyes: Negative for blurred vision.  Gastrointestinal: Negative for abdominal pain.   Maternal Medical History:  Contractions: Frequency: irregular.   Perceived severity is mild.    Fetal activity:  Perceived fetal activity is normal.    Prenatal Complications - Diabetes: none.      Last menstrual period 03/16/2015, unknown if currently breastfeeding. Maternal Exam:  Uterine Assessment: Contraction strength is mild.  Contraction frequency is irregular.   Abdomen: Patient reports no abdominal tenderness. Fetal presentation: vertex  Introitus: Normal vulva. Normal vagina.  Pelvis: adequate for delivery.      Physical Exam  Constitutional: She appears well-developed.  Cardiovascular: Normal rate and regular rhythm.   Respiratory: Effort normal.  GI: Soft.  Genitourinary: Vagina normal.  Neurological: She is alert.  Psychiatric: She has a normal mood and affect.    Prenatal labs: ABO, Rh: A/Positive/-- (05/04 0000) Antibody: Negative (05/04 0000) Rubella: Immune (05/04 0000) RPR: Nonreactive (05/04 0000)  HBsAg: Negative (05/04 0000)  HIV: Non-reactive (05/04 0000)  GBS: Positive (11/01 0000)  One hour GCT WNL 94  Assessment/Plan: Pt here for IOL at term.  Will get PCN for +GBS then after on board plan AROM.   Oliver PilaICHARDSON,Elizabeth Moon 12/16/2015, 8:09 AM

## 2015-12-17 ENCOUNTER — Inpatient Hospital Stay (HOSPITAL_COMMUNITY): Payer: 59 | Admitting: Anesthesiology

## 2015-12-17 ENCOUNTER — Encounter (HOSPITAL_COMMUNITY): Payer: Self-pay

## 2015-12-17 ENCOUNTER — Inpatient Hospital Stay (HOSPITAL_COMMUNITY)
Admission: RE | Admit: 2015-12-17 | Discharge: 2015-12-19 | DRG: 775 | Disposition: A | Discharge status: Home / Self Care | Payer: 59 | Source: Ambulatory Visit | Attending: Obstetrics and Gynecology | Admitting: Obstetrics and Gynecology

## 2015-12-17 DIAGNOSIS — O99824 Streptococcus B carrier state complicating childbirth: Secondary | ICD-10-CM | POA: Diagnosis present

## 2015-12-17 DIAGNOSIS — Z87891 Personal history of nicotine dependence: Secondary | ICD-10-CM | POA: Diagnosis not present

## 2015-12-17 DIAGNOSIS — Z833 Family history of diabetes mellitus: Secondary | ICD-10-CM

## 2015-12-17 DIAGNOSIS — E039 Hypothyroidism, unspecified: Secondary | ICD-10-CM | POA: Diagnosis present

## 2015-12-17 DIAGNOSIS — Z3A39 39 weeks gestation of pregnancy: Secondary | ICD-10-CM | POA: Diagnosis not present

## 2015-12-17 DIAGNOSIS — O99284 Endocrine, nutritional and metabolic diseases complicating childbirth: Secondary | ICD-10-CM | POA: Diagnosis present

## 2015-12-17 DIAGNOSIS — Z823 Family history of stroke: Secondary | ICD-10-CM

## 2015-12-17 DIAGNOSIS — Z3490 Encounter for supervision of normal pregnancy, unspecified, unspecified trimester: Secondary | ICD-10-CM

## 2015-12-17 DIAGNOSIS — Z3493 Encounter for supervision of normal pregnancy, unspecified, third trimester: Secondary | ICD-10-CM | POA: Diagnosis present

## 2015-12-17 LAB — CBC
HEMATOCRIT: 37.9 % (ref 36.0–46.0)
Hemoglobin: 13.1 g/dL (ref 12.0–15.0)
MCH: 31.7 pg (ref 26.0–34.0)
MCHC: 34.6 g/dL (ref 30.0–36.0)
MCV: 91.8 fL (ref 78.0–100.0)
Platelets: 208 10*3/uL (ref 150–400)
RBC: 4.13 MIL/uL (ref 3.87–5.11)
RDW: 12.7 % (ref 11.5–15.5)
WBC: 8.8 10*3/uL (ref 4.0–10.5)

## 2015-12-17 LAB — TYPE AND SCREEN
ABO/RH(D): A POS
Antibody Screen: NEGATIVE

## 2015-12-17 LAB — ABO/RH: ABO/RH(D): A POS

## 2015-12-17 MED ORDER — OXYTOCIN BOLUS FROM INFUSION
500.0000 mL | Freq: Once | INTRAVENOUS | Status: AC
  Administered 2015-12-18: 500 mL via INTRAVENOUS

## 2015-12-17 MED ORDER — DIPHENHYDRAMINE HCL 50 MG/ML IJ SOLN: 12.5000 mg | INTRAMUSCULAR | Status: DC | PRN

## 2015-12-17 MED ORDER — LACTATED RINGERS IV SOLN: 500.0000 mL | INTRAVENOUS | Status: DC | PRN

## 2015-12-17 MED ORDER — OXYCODONE-ACETAMINOPHEN 5-325 MG PO TABS: 1.0000 | ORAL_TABLET | ORAL | Status: DC | PRN

## 2015-12-17 MED ORDER — EPHEDRINE 5 MG/ML INJ: 10.0000 mg | INTRAVENOUS | Status: DC | PRN

## 2015-12-17 MED ORDER — LACTATED RINGERS IV SOLN
500.0000 mL | Freq: Once | INTRAVENOUS | Status: AC
  Administered 2015-12-17: 500 mL via INTRAVENOUS

## 2015-12-17 MED ORDER — OXYCODONE-ACETAMINOPHEN 5-325 MG PO TABS: 2.0000 | ORAL_TABLET | ORAL | Status: DC | PRN

## 2015-12-17 MED ORDER — LACTATED RINGERS IV SOLN: 500.0000 mL | Freq: Once | INTRAVENOUS | Status: DC

## 2015-12-17 MED ORDER — FENTANYL 2.5 MCG/ML BUPIVACAINE 1/10 % EPIDURAL INFUSION (WH - ANES)
14.0000 mL/h | INTRAMUSCULAR | Status: DC | PRN
  Administered 2015-12-17: 14 mL/h via EPIDURAL
  Filled 2015-12-17: qty 100

## 2015-12-17 MED ORDER — ACETAMINOPHEN 325 MG PO TABS: 650.0000 mg | ORAL_TABLET | ORAL | Status: DC | PRN

## 2015-12-17 MED ORDER — TERBUTALINE SULFATE 1 MG/ML IJ SOLN
0.2500 mg | Freq: Once | INTRAMUSCULAR | Status: DC | PRN
  Filled 2015-12-17: qty 1

## 2015-12-17 MED ORDER — PENICILLIN G POTASSIUM 5000000 UNITS IJ SOLR
5.0000 10*6.[IU] | Freq: Once | INTRAMUSCULAR | Status: AC
  Administered 2015-12-17: 5 10*6.[IU] via INTRAVENOUS
  Filled 2015-12-17: qty 5

## 2015-12-17 MED ORDER — LIDOCAINE HCL (PF) 1 % IJ SOLN
30.0000 mL | INTRAMUSCULAR | Status: DC | PRN
  Filled 2015-12-17: qty 30

## 2015-12-17 MED ORDER — PHENYLEPHRINE 40 MCG/ML (10ML) SYRINGE FOR IV PUSH (FOR BLOOD PRESSURE SUPPORT)
80.0000 ug | PREFILLED_SYRINGE | INTRAVENOUS | Status: DC | PRN
  Filled 2015-12-17: qty 5
  Filled 2015-12-17: qty 10

## 2015-12-17 MED ORDER — EPHEDRINE 5 MG/ML INJ
10.0000 mg | INTRAVENOUS | Status: DC | PRN
  Filled 2015-12-17: qty 4

## 2015-12-17 MED ORDER — BUTORPHANOL TARTRATE 1 MG/ML IJ SOLN: 1.0000 mg | INTRAMUSCULAR | Status: DC | PRN

## 2015-12-17 MED ORDER — SOD CITRATE-CITRIC ACID 500-334 MG/5ML PO SOLN: 30.0000 mL | ORAL | Status: DC | PRN

## 2015-12-17 MED ORDER — OXYTOCIN 40 UNITS IN LACTATED RINGERS INFUSION - SIMPLE MED
2.5000 [IU]/h | INTRAVENOUS | Status: DC
  Administered 2015-12-18: 2.5 [IU]/h via INTRAVENOUS

## 2015-12-17 MED ORDER — PHENYLEPHRINE 40 MCG/ML (10ML) SYRINGE FOR IV PUSH (FOR BLOOD PRESSURE SUPPORT): 80.0000 ug | PREFILLED_SYRINGE | INTRAVENOUS | Status: DC | PRN

## 2015-12-17 MED ORDER — OXYTOCIN 40 UNITS IN LACTATED RINGERS INFUSION - SIMPLE MED
1.0000 m[IU]/min | INTRAVENOUS | Status: DC
  Administered 2015-12-17: 2 m[IU]/min via INTRAVENOUS
  Filled 2015-12-17: qty 1000

## 2015-12-17 MED ORDER — LACTATED RINGERS IV SOLN
INTRAVENOUS | Status: DC
  Administered 2015-12-17: 18:00:00 via INTRAVENOUS

## 2015-12-17 MED ORDER — ONDANSETRON HCL 4 MG/2ML IJ SOLN: 4.0000 mg | Freq: Four times a day (QID) | INTRAMUSCULAR | Status: DC | PRN

## 2015-12-17 MED ORDER — PHENYLEPHRINE 40 MCG/ML (10ML) SYRINGE FOR IV PUSH (FOR BLOOD PRESSURE SUPPORT)
80.0000 ug | PREFILLED_SYRINGE | INTRAVENOUS | Status: DC | PRN
  Filled 2015-12-17: qty 5

## 2015-12-17 MED ORDER — FENTANYL 2.5 MCG/ML BUPIVACAINE 1/10 % EPIDURAL INFUSION (WH - ANES): 14.0000 mL/h | INTRAMUSCULAR | Status: DC | PRN

## 2015-12-17 MED ORDER — PENICILLIN G POT IN DEXTROSE 60000 UNIT/ML IV SOLN
3.0000 10*6.[IU] | INTRAVENOUS | Status: DC
  Administered 2015-12-17: 3 10*6.[IU] via INTRAVENOUS
  Filled 2015-12-17 (×5): qty 50

## 2015-12-17 NOTE — Anesthesia Pain Management Evaluation Note (Signed)
  CRNA Pain Management Visit Note  Patient: Elizabeth Moon, 35 y.o., female  "Hello I am a member of the anesthesia team at [facility]. We have an anesthesia team available at all times to provide care throughout the hospital, including epidural management and anesthesia for C-section. I don't know your plan for the delivery whether it a natural birth, water birth, IV sedation, nitrous supplementation, doula or epidural, but we want to meet your pain goals."   1.Was your pain managed to your expectations on prior hospitalizations?   Yes   2.What is your expectation for pain management during this hospitalization?     Epidural  3.How can we help you reach that goal? Epidural   Record the patient's initial score and the patient's pain goal.   Pain: 2  Pain Goal: 7 [facility] wants you to be able to say your pain was always managed very well.  Korbyn Chopin 12/17/2015

## 2015-12-17 NOTE — Progress Notes (Signed)
Patient ID: Elizabeth Moon, female   DOB: 1980-02-12, 35 y.o.   MRN: [id number] Pt finally admitted after being on hold most of day for IOL given hospital busy.   On Pitocin 6 mu and feeling mild cramping. FHR Category 1  Cervix 50/2-3/-2  AROM copious clear  Epidural prn, continue to increase pitocin PCN on board for +GBS

## 2015-12-17 NOTE — Anesthesia Preprocedure Evaluation (Addendum)
Anesthesia Evaluation  Patient identified by MRN, date of birth, ID band Patient awake    Reviewed: Allergy & Precautions, H&P , NPO status , Patient's Chart, lab work & pertinent test results  Airway Mallampati: I  TM Distance: >3 FB Neck ROM: full    Dental no notable dental hx.    Pulmonary neg pulmonary ROS, former smoker,    Pulmonary exam normal        Cardiovascular negative cardio ROS Normal cardiovascular exam     Neuro/Psych negative psych ROS   GI/Hepatic negative GI ROS, Neg liver ROS,   Endo/Other    Renal/GU negative Renal ROS     Musculoskeletal   Abdominal (+) + obese,   Peds  Hematology   Anesthesia Other Findings   Reproductive/Obstetrics (+) Pregnancy                             Anesthesia Physical Anesthesia Plan  ASA: II  Anesthesia Plan: Epidural   Post-op Pain Management:    Induction:   Airway Management Planned:   Additional Equipment:   Intra-op Plan:   Post-operative Plan:   Informed Consent: I have reviewed the patients History and Physical, chart, labs and discussed the procedure including the risks, benefits and alternatives for the proposed anesthesia with the patient or authorized representative who has indicated his/her understanding and acceptance.     Plan Discussed with:   Anesthesia Plan Comments:        Anesthesia Quick Evaluation

## 2015-12-18 ENCOUNTER — Encounter (HOSPITAL_COMMUNITY): Payer: Self-pay

## 2015-12-18 LAB — CBC
HCT: 36.2 % (ref 36.0–46.0)
HEMOGLOBIN: 12.5 g/dL (ref 12.0–15.0)
MCH: 31.6 pg (ref 26.0–34.0)
MCHC: 34.5 g/dL (ref 30.0–36.0)
MCV: 91.4 fL (ref 78.0–100.0)
PLATELETS: 194 10*3/uL (ref 150–400)
RBC: 3.96 MIL/uL (ref 3.87–5.11)
RDW: 12.8 % (ref 11.5–15.5)
WBC: 17.6 10*3/uL — AB (ref 4.0–10.5)

## 2015-12-18 LAB — RPR: RPR: NONREACTIVE

## 2015-12-18 MED ORDER — SENNOSIDES-DOCUSATE SODIUM 8.6-50 MG PO TABS
2.0000 | ORAL_TABLET | ORAL | Status: DC
  Administered 2015-12-18: 2 via ORAL
  Filled 2015-12-18: qty 2

## 2015-12-18 MED ORDER — ONDANSETRON HCL 4 MG PO TABS: 4.0000 mg | ORAL_TABLET | ORAL | Status: DC | PRN

## 2015-12-18 MED ORDER — SIMETHICONE 80 MG PO CHEW: 80.0000 mg | CHEWABLE_TABLET | ORAL | Status: DC | PRN

## 2015-12-18 MED ORDER — DIBUCAINE 1 % RE OINT: 1.0000 "application " | TOPICAL_OINTMENT | RECTAL | Status: DC | PRN

## 2015-12-18 MED ORDER — ZOLPIDEM TARTRATE 5 MG PO TABS: 5.0000 mg | ORAL_TABLET | Freq: Every evening | ORAL | Status: DC | PRN

## 2015-12-18 MED ORDER — PRENATAL MULTIVITAMIN CH
1.0000 | ORAL_TABLET | Freq: Every day | ORAL | Status: DC
  Administered 2015-12-18 – 2015-12-19 (×2): 1 via ORAL
  Filled 2015-12-18 (×2): qty 1

## 2015-12-18 MED ORDER — WITCH HAZEL-GLYCERIN EX PADS: 1.0000 "application " | MEDICATED_PAD | CUTANEOUS | Status: DC | PRN

## 2015-12-18 MED ORDER — LIDOCAINE HCL (PF) 1 % IJ SOLN
INTRAMUSCULAR | Status: DC | PRN
  Administered 2015-12-17: 7 mL via EPIDURAL
  Administered 2015-12-17: 8 mL via EPIDURAL

## 2015-12-18 MED ORDER — IBUPROFEN 600 MG PO TABS
600.0000 mg | ORAL_TABLET | Freq: Four times a day (QID) | ORAL | Status: DC
  Administered 2015-12-18 – 2015-12-19 (×6): 600 mg via ORAL
  Filled 2015-12-18 (×6): qty 1

## 2015-12-18 MED ORDER — OXYCODONE HCL 5 MG PO TABS: 10.0000 mg | ORAL_TABLET | ORAL | Status: DC | PRN

## 2015-12-18 MED ORDER — COCONUT OIL OIL: 1.0000 "application " | TOPICAL_OIL | Status: DC | PRN

## 2015-12-18 MED ORDER — ONDANSETRON HCL 4 MG/2ML IJ SOLN: 4.0000 mg | INTRAMUSCULAR | Status: DC | PRN

## 2015-12-18 MED ORDER — DIPHENHYDRAMINE HCL 25 MG PO CAPS: 25.0000 mg | ORAL_CAPSULE | Freq: Four times a day (QID) | ORAL | Status: DC | PRN

## 2015-12-18 MED ORDER — OXYCODONE HCL 5 MG PO TABS
5.0000 mg | ORAL_TABLET | ORAL | Status: DC | PRN
  Administered 2015-12-18: 5 mg via ORAL
  Filled 2015-12-18: qty 1

## 2015-12-18 MED ORDER — BENZOCAINE-MENTHOL 20-0.5 % EX AERO: 1.0000 "application " | INHALATION_SPRAY | CUTANEOUS | Status: DC | PRN

## 2015-12-18 MED ORDER — TETANUS-DIPHTH-ACELL PERTUSSIS 5-2.5-18.5 LF-MCG/0.5 IM SUSP: 0.5000 mL | Freq: Once | INTRAMUSCULAR | Status: DC

## 2015-12-18 MED ORDER — ACETAMINOPHEN 325 MG PO TABS
650.0000 mg | ORAL_TABLET | ORAL | Status: DC | PRN
  Administered 2015-12-18: 650 mg via ORAL
  Filled 2015-12-18: qty 2

## 2015-12-18 NOTE — Progress Notes (Signed)
Patient ID: Elizabeth Moon, female   DOB: 02/08/1980, 35 y.o.   MRN: [id number] Pt comfortable with epidural  afeb vss FHR Category 1 with occasional mild variables  80/4-5/-1  IUPC placed to adjust pitocin as needed

## 2015-12-18 NOTE — Lactation Note (Signed)
This note was copied from a baby's chart. Lactation Consultation Note  Patient Name: Elizabeth Moon [id number]'s Date: 12/18/2015 Reason for consult: Initial assessment Baby at 11 hr of life. Experienced bf mom denies breast or nipple pain, voiced no concerns. She is currently not being Tx for thyroid issues and confirmed the PCOS Dx. Discussed possible impact on bf and encouraged her to F/U with her MD. Blondell Revealffered DEBP but she declined. She thinks baby is latching well and she has been able to manually express milk. There are spoons in the room if she decides to offer expressed milk. Discussed baby behavior, feeding frequency, pacifier use, baby belly size, voids, wt loss, breast changes, and nipple care. Given lactation handouts. Aware of OP services and support group.    Maternal Data Has patient been taught Hand Expression?: Yes Does the patient have breastfeeding experience prior to this delivery?: Yes  Feeding Feeding Type: Breast Fed  LATCH Score/Interventions Latch: Grasps breast easily, tongue down, lips flanged, rhythmical sucking.  Audible Swallowing: A few with stimulation  Type of Nipple: Everted at rest and after stimulation  Comfort (Breast/Nipple): Soft / non-tender     Hold (Positioning): No assistance needed to correctly position infant at breast.  LATCH Score: 9  Lactation Tools Discussed/Used WIC Program: No   Consult Status Consult Status: Follow-up Date: 12/19/15 Follow-up type: In-patient    Elizabeth Moon 12/18/2015, 1:22 PM

## 2015-12-18 NOTE — Anesthesia Postprocedure Evaluation (Signed)
Anesthesia Post Note  Patient: Elizabeth Moon  Procedure(s) Performed: * No procedures listed *  Patient location during evaluation: Mother Baby Anesthesia Type: Epidural Level of consciousness: awake and alert Pain management: pain level controlled Vital Signs Assessment: post-procedure vital signs reviewed and stable Respiratory status: spontaneous breathing, nonlabored ventilation and respiratory function stable Cardiovascular status: stable Postop Assessment: no headache, no backache, epidural receding and patient able to bend at knees Anesthetic complications: no     Last Vitals:  Vitals:   12/18/15 0900 12/18/15 1620  BP: 95/69 111/72  Pulse: 74 60  Resp: 18 18  Temp: 36.6 C 36.9 C    Last Pain:  Vitals:   12/18/15 1620  TempSrc: Oral  PainSc:    Pain Goal: Patients Stated Pain Goal: 2 (12/18/15 1619)               Rica RecordsICKELTON,[name]

## 2015-12-18 NOTE — Anesthesia Procedure Notes (Signed)
Epidural Patient location during procedure: OB Start time: 12/17/2015 11:37 PM End time: 12/17/2015 11:41 PM  Staffing Anesthesiologist: Leilani AbleHATCHETT, Sharnee Douglass  Preanesthetic Checklist Completed: patient identified, surgical consent, pre-op evaluation, timeout performed, IV checked, risks and benefits discussed and monitors and equipment checked  Epidural Patient position: sitting Prep: site prepped and draped and DuraPrep Patient monitoring: continuous pulse ox and blood pressure Approach: midline Location: L3-L4 Injection technique: LOR air  Needle:  Needle type: Tuohy  Needle gauge: 17 G Needle length: 9 cm and 9 Needle insertion depth: 7 cm Catheter type: closed end flexible Catheter size: 19 Gauge Catheter at skin depth: 12 cm Test dose: negative and Other  Assessment Sensory level: T9 Events: blood not aspirated, injection not painful, no injection resistance, negative IV test and no paresthesia  Additional Notes Reason for block:procedure for pain

## 2015-12-18 NOTE — Progress Notes (Signed)
Post Partum Day 0 Subjective: no complaints and tolerating PO  Objective: Blood pressure 110/64, pulse 63, temperature 98.4 F (36.9 C), temperature source Oral, resp. rate 18, height 5\' 7"  (1.702 m), weight 93 kg (205 lb), last menstrual period 03/16/2015, SpO2 99 %, unknown if currently breastfeeding.  Physical Exam:  General: alert and cooperative Lochia: appropriate Uterine Fundus: firm   Recent Labs  12/17/15 1757 12/18/15 0528  HGB 13.1 12.5  HCT 37.9 36.2    Assessment/Plan: Plan for discharge tomorrow  D/w pt circumcision and desires in hospital, will plan tomorrow AM   LOS: 1 day   Elizabeth Moon W 12/18/2015, 10:31 AM

## 2015-12-19 MED ORDER — IBUPROFEN 800 MG PO TABS: 800.0000 mg | ORAL_TABLET | Freq: Three times a day (TID) | ORAL | 1 refills | Status: DC | PRN

## 2015-12-19 MED ORDER — PRENATAL MULTIVITAMIN CH: 1.0000 | ORAL_TABLET | Freq: Every day | ORAL | 3 refills | Status: DC

## 2015-12-19 NOTE — Lactation Note (Signed)
This note was copied from a baby's chart. Lactation Consultation Note  Patient Name: Boy Nilsa Nuttingshley Angelucci ZOXWR'UToday's Date: 12/19/2015 Reason for consult: Follow-up assessment   Baby 34 hours old and sleepy at breast. Mom reports that baby circumcised about 3 hours earlier--so discussed impact of Tylenol and the procedure. Assisted mom with latching baby to left breast in cross-cradle position. Enc mom to support baby's head in a c-hold. Baby latched briefly, but would not suckle. Assisted mom with hand expression and spoon-feeding EBM. Enc mom to keep attempting to latch and to use spoon-fed EBM to enc baby to latch and to supplement if baby not willing to latch. Mom given comfort gels with review, and enc to use EBM on nipples for healing. Referred parents to Baby and Me booklet for number of diapers to expect by day of life and EBM storage guidelines. Mom aware of OP/BFSG and LC phone line assistance after D/C.   Maternal Data    Feeding Feeding Type: Breast Fed Length of feed: 0 min  LATCH Score/Interventions Latch: Too sleepy or reluctant, no latch achieved, no sucking elicited. Intervention(s): Skin to skin;Waking techniques  Audible Swallowing: None Intervention(s): Hand expression  Type of Nipple: Everted at rest and after stimulation  Comfort (Breast/Nipple): Filling, red/small blisters or bruises, mild/mod discomfort  Problem noted: Mild/Moderate discomfort Interventions (Mild/moderate discomfort): Comfort gels  Hold (Positioning): Assistance needed to correctly position infant at breast and maintain latch. Intervention(s): Breastfeeding basics reviewed;Support Pillows;Position options;Skin to skin  LATCH Score: 4  Lactation Tools Discussed/Used     Consult Status Consult Status: Follow-up Date: 12/20/15 Follow-up type: In-patient    Sherlyn HayJennifer D Delores Edelstein 12/19/2015, 12:14 PM

## 2015-12-19 NOTE — Discharge Summary (Signed)
OB Discharge Summary     Patient Name: Elizabeth Moon DOB: 24-Nov-1980 MRN: [id number]  Date of admission: 12/17/2015 Delivering MD: Huel CoteICHARDSON, KATHY   Date of discharge: 12/19/2015  Admitting diagnosis: 39 WKS, INDUCTION Intrauterine pregnancy: 6679w4d     Secondary diagnosis:  Active Problems:   NSVD (normal spontaneous vaginal delivery)   Term pregnancy  Additional problems: + GBBS     Discharge diagnosis: Term Pregnancy Delivered                                                                                                Post partum procedures:none  Augmentation: AROM and Pitocin  Complications: None  Hospital course:  Induction of Labor With Vaginal Delivery   35 y.o. yo [phone] at 4179w4d was admitted to the hospital 12/17/2015 for induction of labor.  Indication for induction: Favorable cervix at term.  Patient had an uncomplicated labor course as follows: Membrane Rupture Time/Date: 9:00 PM ,12/17/2015   Intrapartum Procedures: Episiotomy: None [1]                                         Lacerations:  None [1]  Patient had delivery of a Viable infant.  Information for the patient's newborn:  Elizabeth Moon, Elizabeth Moon [[id number]][[id number]]  Delivery Method: Vaginal, Spontaneous Delivery (Filed from Delivery Summary)   12/18/2015  Details of delivery can be found in separate delivery note.  Patient had a routine postpartum course. Patient is discharged home 12/19/15.   Physical exam Vitals:   12/18/15 0900 12/18/15 1620 12/18/15 1800 12/19/15 0638  BP: 95/69 111/72 120/77 106/74  Pulse: 74 60 67 84  Resp: 18 18 19 18   Temp: 97.9 F (36.6 C) 98.4 F (36.9 C) 98.2 F (36.8 C) 97.8 F (36.6 C)  TempSrc: Oral Oral  Oral  SpO2: 97%     Weight:      Height:       General: alert and no distress Lochia: appropriate Uterine Fundus: firm  Labs: Lab Results  Component Value Date   WBC 17.6 (H) 12/18/2015   HGB 12.5 12/18/2015   HCT 36.2 12/18/2015   MCV 91.4 12/18/2015   PLT 194 12/18/2015   CMP Latest Ref Rng & Units 10/01/2009  Glucose 70 - 99 mg/dL 90  BUN 6 - 23 mg/dL 8  Creatinine 0.4 - 1.2 mg/dL 6.300.54  Sodium 160135 - 109145 mEq/L 135  Potassium 3.5 - 5.1 mEq/L 3.4(L)  Chloride 96 - 112 mEq/L 106  CO2 19 - 32 mEq/L 24  Calcium 8.4 - 10.5 mg/dL 9.1    Discharge instruction: per After Visit Summary and "Baby and Me Booklet".  After visit meds:    Medication List    TAKE these medications   famotidine 20 MG tablet Commonly known as:  PEPCID Take 20 mg by mouth 2 (two) times daily. Reported on 06/27/2015   ibuprofen 800 MG tablet Commonly known as:  ADVIL,MOTRIN Take 1 tablet (800 mg total) by mouth every 8 (eight) hours  as needed.   prenatal multivitamin Tabs tablet Take 1 tablet by mouth daily at 12 noon.       Diet: routine diet  Activity: Advance as tolerated. Pelvic rest for 6 weeks.   Outpatient follow up:6 weeks Follow up Appt:No future appointments. Follow up Visit:No Follow-up on file.  Postpartum contraception: Progesterone only pills  Newborn Data: Live born female  Birth Weight: 7 lb 5.6 oz (3335 g) APGAR: 9, 9  Baby Feeding: Breast Disposition:home with mother   12/19/2015 Elizabeth Moon, Elizabeth Erney, MD

## 2015-12-19 NOTE — Progress Notes (Addendum)
Post Partum Day 1 Subjective: no complaints, up ad lib, tolerating PO and nl lochia, pain controlled  Objective: Blood pressure 106/74, pulse 84, temperature 97.8 F (36.6 C), temperature source Oral, resp. rate 18, height 5\' 7"  (1.702 m), weight 93 kg (205 lb), last menstrual period 03/16/2015, SpO2 97 %, unknown if currently breastfeeding.  Physical Exam:  General: alert and no distress Lochia: appropriate Uterine Fundus: firm   Recent Labs  12/17/15 1757 12/18/15 0528  HGB 13.1 12.5  HCT 37.9 36.2    Assessment/Plan: Plan for discharge tomorrow, Breastfeeding and Lactation consult.  routine PP care.  Desires d/c home.  D/C with motrin and PNV   LOS: 2 days   Bovard-Stuckert, Serita Degroote 12/19/2015, 8:52 AM

## 2015-12-19 NOTE — Procedures (Signed)
CIRCUMCISION NOTE  D/W mother r/b/a and procedure - wishes to proceed, ID verified Ring block with 1% LIDOCAINE cIRCUMCISION WITH 1.1 GOMCO, W/O DIFF/COMP Hemostatic with gelfoam

## 2015-12-19 NOTE — Anesthesia Postprocedure Evaluation (Signed)
Anesthesia Post Note  Patient: Elizabeth Moon  Procedure(s) Performed: * No procedures listed *  Patient location during evaluation: Mother Baby Anesthesia Type: Epidural Level of consciousness: awake Pain management: pain level controlled Vital Signs Assessment: post-procedure vital signs reviewed and stable Respiratory status: spontaneous breathing Cardiovascular status: stable Postop Assessment: no headache, no backache, epidural receding and patient able to bend at knees Anesthetic complications: no     Last Vitals:  Vitals:   12/18/15 1800 12/19/15 0638  BP: 120/77 106/74  Pulse: 67 84  Resp: 19 18  Temp: 36.8 C 36.6 C    Last Pain:  Vitals:   12/19/15 0638  TempSrc: Oral  PainSc:    Pain Goal: Patients Stated Pain Goal: 3 (12/18/15 2321)               Edison PaceWILKERSON,Babak Lucus

## 2016-02-13 DIAGNOSIS — Z1389 Encounter for screening for other disorder: Secondary | ICD-10-CM | POA: Diagnosis not present

## 2016-06-29 ENCOUNTER — Encounter: Payer: Self-pay | Admitting: Family Medicine

## 2016-06-29 ENCOUNTER — Ambulatory Visit (INDEPENDENT_AMBULATORY_CARE_PROVIDER_SITE_OTHER): Payer: 59 | Admitting: Family Medicine

## 2016-06-29 VITALS — BP 110/74 | Ht 67.0 in | Wt 181.5 lb

## 2016-06-29 DIAGNOSIS — S161XXA Strain of muscle, fascia and tendon at neck level, initial encounter: Secondary | ICD-10-CM

## 2016-06-29 DIAGNOSIS — G5603 Carpal tunnel syndrome, bilateral upper limbs: Secondary | ICD-10-CM

## 2016-06-29 MED ORDER — TIZANIDINE HCL 4 MG PO TABS: ORAL_TABLET | ORAL | 0 refills | Status: DC

## 2016-06-29 MED ORDER — HYDROCODONE-ACETAMINOPHEN 5-325 MG PO TABS: ORAL_TABLET | ORAL | 0 refills | Status: DC

## 2016-06-29 NOTE — Progress Notes (Signed)
   Subjective:    Patient ID: Elizabeth Moon, female    DOB: 1980-10-08, 36 y.o.   MRN: [id number]  Neck Pain   This is a recurrent problem. The current episode started more than 1 year ago. She has tried NSAIDs for the symptoms.   Hx of bone cervical bone spur  Noted spurring in the neck  Over the yrs has tried exerisee, did therapy  Feels tight at times  Aches at times  Tend to shoot into the arm,  Takes aleave motin sometimes rx med, takes occ hydrocod, b c powder,  Wakes up at night n occas arms and hand nub and tingly    works in Production assistant, radioskilled faility and doseercises   Was doing gr with exercise, not so much lately   Patient has had challenges in the past with her spine. Prior MRIs reviewed at length  Patient states no other concerns this visit.  Review of Systems  Musculoskeletal: Positive for neck pain.       Objective:   Physical Exam  Alert and oriented, vitals reviewed and stable, NAD ENT-TM's and ext canals WNL bilat via otoscopic exam Soft palate, tonsils and post pharynx WNL via oropharyngeal exam Neck-symmetric, no masses; thyroid nonpalpable and nontender Pulmonary-no tachypnea or accessory muscle use; Clear without wheezes via auscultation Card--no abnrml murmurs, rhythm reg and rate WNL Carotid pulses symmetric, without bruits Positive paraspinal cervical tenderness bilateral.  Positive Tinel's and Phalen's sign.      Assessment & Plan:  Impression bilateral trapezius cervical strain/spasm likely secondary to increase stress. Doubt that this is true neuropathic pain at this time even though patient does have some history of mild disc disease MRI. Discussed at great length #2 carpal tunnel syndrome bilateral discussed trying nighttime splint for taking it to the next level. Rationale discussed. Use over-the-counter ibuprofen. Add prescription spasm medicine. Local measures discussed. Add rare narcotic if necessary. Prescribed. Nighttime splinting  recommended rationale discussed

## 2017-01-10 ENCOUNTER — Encounter: Payer: Self-pay | Admitting: Nurse Practitioner

## 2017-01-10 ENCOUNTER — Ambulatory Visit (INDEPENDENT_AMBULATORY_CARE_PROVIDER_SITE_OTHER): Payer: 59 | Admitting: Nurse Practitioner

## 2017-01-10 VITALS — BP 120/82 | HR 78 | Temp 99.1°F | Ht 67.0 in | Wt 171.8 lb

## 2017-01-10 DIAGNOSIS — J019 Acute sinusitis, unspecified: Secondary | ICD-10-CM

## 2017-01-10 MED ORDER — AMOXICILLIN-POT CLAVULANATE 875-125 MG PO TABS: 1.0000 | ORAL_TABLET | Freq: Two times a day (BID) | ORAL | 0 refills | Status: DC

## 2017-01-10 MED ORDER — HYDROCODONE-HOMATROPINE 5-1.5 MG/5ML PO SYRP: 5.0000 mL | ORAL_SOLUTION | ORAL | 0 refills | Status: DC | PRN

## 2017-01-10 NOTE — Patient Instructions (Addendum)
claritin or allegra  Nasacort AQ

## 2017-01-11 ENCOUNTER — Encounter: Payer: Self-pay | Admitting: Nurse Practitioner

## 2017-01-11 ENCOUNTER — Other Ambulatory Visit: Payer: Self-pay | Admitting: Nurse Practitioner

## 2017-01-11 ENCOUNTER — Telehealth: Payer: Self-pay | Admitting: Family Medicine

## 2017-01-11 MED ORDER — SULFACETAMIDE SODIUM 10 % OP SOLN: 1.0000 [drp] | OPHTHALMIC | 0 refills | Status: DC

## 2017-01-11 NOTE — Telephone Encounter (Signed)
Done

## 2017-01-11 NOTE — Progress Notes (Signed)
Subjective: Presents for complaints of cough and congestion for the past few weeks.  Increased cough for the past 2 nights.  Has also noticed some redness and drainage in her eyes for the past couple of days.  Postnasal drainage.  Sore throat.  No ear pain.  Right-sided facial area headache.  Objective:   BP 120/82 (BP Location: Right Arm, Patient Position: Sitting)   Pulse 78   Temp 99.1 F (37.3 C) (Oral)   Ht 5\' 7"  (1.702 m)   Wt 171 lb 12.8 oz (77.9 kg)   LMP 12/17/2016 (Approximate)   SpO2 98%   BMI 26.91 kg/m  NAD.  Alert, oriented.  TMs clear effusion, no erythema.  Pharynx injected with PND noted.  Neck supple with mild soft anterior adenopathy.  Lungs clear.  Heart regular rate and rhythm.  Assessment:  Acute rhinosinusitis    Plan:   Meds ordered this encounter  Medications  . amoxicillin-clavulanate (AUGMENTIN) 875-125 MG tablet    Sig: Take 1 tablet by mouth 2 (two) times daily.    Dispense:  20 tablet    Refill:  0    Order Specific Question:   Supervising Provider    Answer:   Merlyn AlbertLUKING, WILLIAM S [[id number]]  . HYDROcodone-homatropine (HYCODAN) 5-1.5 MG/5ML syrup    Sig: Take 5 mLs by mouth every 4 (four) hours as needed.    Dispense:  90 mL    Refill:  0    Order Specific Question:   Supervising Provider    Answer:   Merlyn AlbertLUKING, WILLIAM S [[id number]]   OTC antihistamine and steroid nasal spray as directed.  Drowsiness precautions taking Hycodan syrup.  Call back if symptoms worsen or persist.

## 2017-01-11 NOTE — Telephone Encounter (Signed)
Pt is needing something called in for pink eye. Pt woke up this morning with eye matted.    [facility] [place]

## 2017-04-04 DIAGNOSIS — Z01419 Encounter for gynecological examination (general) (routine) without abnormal findings: Secondary | ICD-10-CM | POA: Diagnosis not present

## 2017-04-04 DIAGNOSIS — Z130 Encounter for screening for diseases of the blood and blood-forming organs and certain disorders involving the immune mechanism: Secondary | ICD-10-CM | POA: Diagnosis not present

## 2017-04-18 ENCOUNTER — Ambulatory Visit: Payer: 59 | Admitting: Family Medicine

## 2017-04-18 ENCOUNTER — Encounter: Payer: Self-pay | Admitting: Family Medicine

## 2017-04-18 VITALS — BP 118/76 | Temp 98.1°F | Ht 67.0 in | Wt 173.6 lb

## 2017-04-18 DIAGNOSIS — J019 Acute sinusitis, unspecified: Secondary | ICD-10-CM | POA: Diagnosis not present

## 2017-04-18 DIAGNOSIS — M26621 Arthralgia of right temporomandibular joint: Secondary | ICD-10-CM | POA: Diagnosis not present

## 2017-04-18 MED ORDER — MELOXICAM 15 MG PO TABS: 15.0000 mg | ORAL_TABLET | Freq: Every day | ORAL | 1 refills | Status: DC

## 2017-04-18 MED ORDER — AMOXICILLIN-POT CLAVULANATE 875-125 MG PO TABS: 1.0000 | ORAL_TABLET | Freq: Two times a day (BID) | ORAL | 0 refills | Status: AC

## 2017-04-18 MED ORDER — TIZANIDINE HCL 4 MG PO CAPS: 4.0000 mg | ORAL_CAPSULE | Freq: Three times a day (TID) | ORAL | 1 refills | Status: DC | PRN

## 2017-04-18 NOTE — Progress Notes (Signed)
   Subjective:    Patient ID: Elizabeth Moon, female    DOB: 1980/11/01, 37 y.o.   MRN: [id number]   Sinus Problem   This is a new problem. The current episode started 1 to 4 weeks ago. Associated symptoms include congestion, ear pain, headaches, sinus pressure and a sore throat. Treatments tried: allergy meds.   Several weeks ago woke up with severe   shoting pain in face and jaw and painful with openin jaw and chewing, next two days, Pos pain with movement up and down, tender on and off since  Still tend in parts of the face  Nose   Running  This week, dim energy   Taking zyrtec   Pt took hydrocod motrin   pos pressure headache , took muscle relaxer an added mobic  Now using motrin   Quite painful with the chewing when it hit          Review of Systems  HENT: Positive for congestion, ear pain, sinus pressure and sore throat.   Neurological: Positive for headaches.       Objective:   Physical Exam  Alert, mild malaise. Hydration good Vitals stable. frontal/ maxillary tenderness evident positive nasal congestion. pharynx normal neck supple  lungs clear/no crackles or wheezes. heart regular in rhythm Positive right TMJ pain ClearSome tenderness right lateral sternocleidomastoid muscle    Assessment & Plan:  Impression rhinosinusitis likely post viral, discussed with patient. plan antibiotics prescribed. Questions answered. Symptomatic care discussed. warning signs discussed. WSL  Plus mobic and zanalfex for muscle spasm and right tmj  educ info given

## 2017-04-18 NOTE — Patient Instructions (Signed)
Temporomandibular Joint Syndrome Temporomandibular joint (TMJ) syndrome is a condition that affects the joints between your jaw and your skull. The TMJs are located near your ears and allow your jaw to open and close. These joints and the nearby muscles are involved in all movements of the jaw. People with TMJ syndrome have pain in the area of these joints and muscles. Chewing, biting, or other movements of the jaw can be difficult or painful. TMJ syndrome can be caused by various things. In many cases, the condition is mild and goes away within a few weeks. For some people, the condition can become a long-term problem. What are the causes? Possible causes of TMJ syndrome include:  Grinding your teeth or clenching your jaw. Some people do this when they are under stress.  Arthritis.  Injury to the jaw.  Head or neck injury.  Teeth or dentures that are not aligned well.  In some cases, the cause of TMJ syndrome may not be known. What are the signs or symptoms? The most common symptom is an aching pain on the side of the head in the area of the TMJ. Other symptoms may include:  Pain when moving your jaw, such as when chewing or biting.  Being unable to open your jaw all the way.  Making a clicking sound when you open your mouth.  Headache.  Earache.  Neck or shoulder pain.  How is this diagnosed? Diagnosis can usually be made based on your symptoms, your medical history, and a physical exam. Your health care provider may check the range of motion of your jaw. Imaging tests, such as X-rays or an MRI, are sometimes done. You may need to see your dentist to determine if your teeth and jaw are lined up correctly. How is this treated? TMJ syndrome often goes away on its own. If treatment is needed, the options may include:  Eating soft foods and applying ice or heat.  Medicines to relieve pain or inflammation.  Medicines to relax the muscles.  A splint, bite plate, or mouthpiece  to prevent teeth grinding or jaw clenching.  Relaxation techniques or counseling to help reduce stress.  Transcutaneous electrical nerve stimulation (TENS). This helps to relieve pain by applying an electrical current through the skin.  Acupuncture. This is sometimes helpful to relieve pain.  Jaw surgery. This is rarely needed.  Follow these instructions at home:  Take medicines only as directed by your health care provider.  Eat a soft diet if you are having trouble chewing.  Apply ice to the painful area. ? Put ice in a plastic bag. ? Place a towel between your skin and the bag. ? Leave the ice on for 20 minutes, 2-3 times a day.  Apply a warm compress to the painful area as directed.  Massage your jaw area and perform any jaw stretching exercises as recommended by your health care provider.  If you were given a mouthpiece or bite plate, wear it as directed.  Avoid foods that require a lot of chewing. Do not chew gum.  Keep all follow-up visits as directed by your health care provider. This is important. Contact a health care provider if:  You are having trouble eating.  You have new or worsening symptoms. Get help right away if:  Your jaw locks open or closed. This information is not intended to replace advice given to you by your health care provider. Make sure you discuss any questions you have with your health care provider. Document   Released: 10/03/2000 Document Revised: 09/08/2015 Document Reviewed: 08/13/2013 Elsevier Interactive Patient Education  2018 Elsevier Inc.  

## 2018-01-22 NOTE — L&D Delivery Note (Signed)
Delivery Note Pt reached complete dilation and pushed about 15 minutes and at 3:22 PM a viable female was delivered via Vaginal, Spontaneous (Presentation: rotated to LOA on perineum  ).  APGAR: 9, 9; weight pending  .   Placenta status:delivered spontaneously .  Cord:  with the following complications: nuchal x 1 reduced.  none  Anesthesia:  Epidural Episiotomy: None Lacerations: None Suture Repair: n/a Est. Blood Loss (mL): 329  Mom to postpartum.  Baby to Couplet care / Skin to Skin.  Logan Bores 12/09/2018, 3:37 PM

## 2018-04-23 ENCOUNTER — Other Ambulatory Visit: Payer: Self-pay

## 2018-04-23 ENCOUNTER — Encounter: Payer: Self-pay | Admitting: Family Medicine

## 2018-04-23 ENCOUNTER — Telehealth: Payer: Self-pay | Admitting: Family Medicine

## 2018-04-23 ENCOUNTER — Ambulatory Visit (INDEPENDENT_AMBULATORY_CARE_PROVIDER_SITE_OTHER): Payer: Self-pay | Admitting: Family Medicine

## 2018-04-23 VITALS — Wt 165.0 lb

## 2018-04-23 DIAGNOSIS — K529 Noninfective gastroenteritis and colitis, unspecified: Secondary | ICD-10-CM

## 2018-04-23 MED ORDER — ONDANSETRON 4 MG PO TBDP: ORAL_TABLET | ORAL | 0 refills | Status: DC

## 2018-04-23 NOTE — Telephone Encounter (Signed)
Pt had phone visit with Dr.Steve today. 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 Needs work excuse through Sunday 04/27/2018. Please email work note. Thank you

## 2018-04-23 NOTE — Progress Notes (Signed)
   Subjective:    Patient ID: Elizabeth Moon, female    DOB: 11/29/1980, 38 y.o.   MRN: [id number]  Diarrhea   This is a new problem. Episode onset: sunday morning woke up and feeling terrible.  Associated symptoms include bloating, a fever, myalgias and vomiting. Associated symptoms comments: Low grade fever (99.5-99.9). Treatments tried: Pepto Bismol. The treatment provided no relief.    Pt states that she has diarrhea and vomiting Sunday morning. Symptoms subsided until this morning. Just has diarrhea this morning.  Pt works in a hospital.  Virtual Visit via Telephone Note  I connected with Elizabeth Moon on 04/23/18 at 11:00 AM EDT by telephone and verified that I am speaking with the correct person using two identifiers.   I discussed the limitations, risks, security and privacy concerns of performing an evaluation and management service by telephone and the availability of in person appointments. I also discussed with the patient that there may be a patient responsible charge related to this service. The patient expressed understanding and agreed to proceed.  vo times two  Dim energy  Low gr temp  Diarrhea this morn  Low gr temp and nausea and fatue  usimg pept  deals  History of Present Illness:    Observations/Objective:   Assessment and Plan:   Follow Up Instructions:    I discussed the assessment and treatment plan with the patient. The patient was provided an opportunity to ask questions and all were answered. The patient agreed with the plan and demonstrated an understanding of the instructions.   The patient was advised to call back or seek an in-person evaluation if the symptoms worsen or if the condition fails to improve as anticipated.  I provided  minutes of non-face-to-face time during this encounter.   Marlowe [place], LPN   Review of Systems  Constitutional: Positive for fever.  Gastrointestinal: Positive for bloating, diarrhea and vomiting.   Musculoskeletal: Positive for myalgias.       Objective:   Physical Exam   Exam not performed secondary to telehealth     Assessment & Plan:  Gastroenteritis.  Likely viral.  Likely not coronavirus discussed with family.  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  Out of abundance of caution needs to follow all current recommendations versus considering possible coronavirus exposure.  Discussed.  Zofran as needed.  Warning signs discussed.  Imodium as needed.  Greater than 50% of this 15 minute face to face visit was spent in counseling and discussion and coordination of care regarding the above diagnosis/diagnosies

## 2018-04-24 ENCOUNTER — Encounter: Payer: Self-pay | Admitting: Family Medicine

## 2018-05-20 DIAGNOSIS — Z3A10 10 weeks gestation of pregnancy: Secondary | ICD-10-CM | POA: Diagnosis not present

## 2018-05-20 DIAGNOSIS — Z3201 Encounter for pregnancy test, result positive: Secondary | ICD-10-CM | POA: Diagnosis not present

## 2018-05-20 DIAGNOSIS — N910 Primary amenorrhea: Secondary | ICD-10-CM | POA: Diagnosis not present

## 2018-05-20 DIAGNOSIS — O26891 Other specified pregnancy related conditions, first trimester: Secondary | ICD-10-CM | POA: Diagnosis not present

## 2018-06-09 DIAGNOSIS — E282 Polycystic ovarian syndrome: Secondary | ICD-10-CM | POA: Diagnosis not present

## 2018-06-09 DIAGNOSIS — O09511 Supervision of elderly primigravida, first trimester: Secondary | ICD-10-CM | POA: Diagnosis not present

## 2018-06-09 DIAGNOSIS — Z3A13 13 weeks gestation of pregnancy: Secondary | ICD-10-CM | POA: Diagnosis not present

## 2018-06-09 DIAGNOSIS — Z3689 Encounter for other specified antenatal screening: Secondary | ICD-10-CM | POA: Diagnosis not present

## 2018-06-09 DIAGNOSIS — Z113 Encounter for screening for infections with a predominantly sexual mode of transmission: Secondary | ICD-10-CM | POA: Diagnosis not present

## 2018-06-09 DIAGNOSIS — Z3682 Encounter for antenatal screening for nuchal translucency: Secondary | ICD-10-CM | POA: Diagnosis not present

## 2018-06-09 LAB — OB RESULTS CONSOLE GC/CHLAMYDIA
Chlamydia: NEGATIVE
Gonorrhea: NEGATIVE

## 2018-06-09 LAB — OB RESULTS CONSOLE RPR: RPR: NONREACTIVE

## 2018-06-09 LAB — OB RESULTS CONSOLE HIV ANTIBODY (ROUTINE TESTING): HIV: NONREACTIVE

## 2018-06-09 LAB — OB RESULTS CONSOLE RUBELLA ANTIBODY, IGM: Rubella: IMMUNE

## 2018-06-09 LAB — OB RESULTS CONSOLE ABO/RH: RH Type: POSITIVE

## 2018-06-09 LAB — OB RESULTS CONSOLE ANTIBODY SCREEN: Antibody Screen: NEGATIVE

## 2018-06-09 LAB — OB RESULTS CONSOLE HEPATITIS B SURFACE ANTIGEN: Hepatitis B Surface Ag: NEGATIVE

## 2018-07-22 DIAGNOSIS — Z3A19 19 weeks gestation of pregnancy: Secondary | ICD-10-CM | POA: Diagnosis not present

## 2018-07-22 DIAGNOSIS — Z363 Encounter for antenatal screening for malformations: Secondary | ICD-10-CM | POA: Diagnosis not present

## 2018-07-22 DIAGNOSIS — O09512 Supervision of elderly primigravida, second trimester: Secondary | ICD-10-CM | POA: Diagnosis not present

## 2018-09-04 ENCOUNTER — Other Ambulatory Visit: Payer: Self-pay | Admitting: Obstetrics and Gynecology

## 2018-09-04 DIAGNOSIS — I8393 Asymptomatic varicose veins of bilateral lower extremities: Secondary | ICD-10-CM

## 2018-09-05 ENCOUNTER — Ambulatory Visit (INDEPENDENT_AMBULATORY_CARE_PROVIDER_SITE_OTHER): Payer: BC Managed Care – PPO | Admitting: Family Medicine

## 2018-09-05 ENCOUNTER — Other Ambulatory Visit (HOSPITAL_COMMUNITY)
Admission: RE | Admit: 2018-09-05 | Discharge: 2018-09-05 | Disposition: A | Discharge status: Home / Self Care | Payer: BC Managed Care – PPO | Source: Ambulatory Visit | Attending: *Deleted | Admitting: *Deleted

## 2018-09-05 ENCOUNTER — Other Ambulatory Visit: Payer: Self-pay

## 2018-09-05 ENCOUNTER — Ambulatory Visit (HOSPITAL_COMMUNITY)
Admission: RE | Admit: 2018-09-05 | Discharge: 2018-09-05 | Disposition: A | Discharge status: Home / Self Care | Payer: BC Managed Care – PPO | Source: Ambulatory Visit | Attending: Family Medicine | Admitting: Family Medicine

## 2018-09-05 DIAGNOSIS — M7989 Other specified soft tissue disorders: Secondary | ICD-10-CM

## 2018-09-05 LAB — D-DIMER, QUANTITATIVE: D-Dimer, Quant: 0.48 ug/mL-FEU (ref 0.00–0.50)

## 2018-09-05 NOTE — Progress Notes (Signed)
   Subjective:    Patient ID: Elizabeth Moon, female    DOB: 1980-05-21, 38 y.o.   MRN: [id number]  HPI  Patient arrives with a vein bulging in her right leg behind her knee since pregnancy-worse for last few months. The patient relates that she has varicose veins which is been present from previous pregnancies but worse over the past few months She is pregnant currently with expected delivery in September She now relates soreness on the vein on the inner aspect of the calf as well as pain behind her knee.  She denies any chest pains or shortness of breath.  She denies any's sweats chills fevers.  Energy level overall doing okay.  Has not had a blood clot in the past.  She works in a ICU setting and various colleagues encouraged her to get this checked out.  She is scheduled next week for an ultrasound by her gynecologist.  No family history of blood clots. Review of Systems  Constitutional: Negative for activity change and appetite change.  HENT: Negative for congestion and rhinorrhea.   Respiratory: Negative for cough and shortness of breath.   Cardiovascular: Negative for chest pain and leg swelling.  Gastrointestinal: Negative for abdominal pain, nausea and vomiting.  Skin: Negative for color change.  Neurological: Negative for dizziness and weakness.  Psychiatric/Behavioral: Negative for agitation and confusion.  As above she does have soreness behind the right knee as well as on the inner aspect of the calf where a large varicose veins present     Objective:   Physical Exam Vitals signs reviewed.  Constitutional:      General: She is not in acute distress. HENT:     Head: Normocephalic.  Cardiovascular:     Rate and Rhythm: Normal rate and regular rhythm.     Heart sounds: Normal heart sounds. No murmur.  Pulmonary:     Effort: Pulmonary effort is normal.     Breath sounds: Normal breath sounds.  Lymphadenopathy:     Cervical: No cervical adenopathy.  Neurological:   Mental Status: She is alert.  Psychiatric:        Behavior: Behavior normal.   The circumference of both calves are normal but on the right side there is varicose vein it is easily compressible it is not red but the patient relates some slight tenderness to it she also has subjective tenderness behind the right knee   25 minutes was spent with the patient.  This statement verifies that 25 minutes was indeed spent with the patient.  More than 50% of this visit-total duration of the visit-was spent in counseling and coordination of care. The issues that the patient came in for today as reflected in the diagnosis (s) please refer to documentation for further details.      Assessment & Plan:  Stat lab work and Doppler for DVT was ordered Stat d-dimer as well as Doppler ultrasound D-dimer came back negative Ultrasound came back negative Patient was counseled regarding this  I also told her warm compresses compression hose keep that leg elevated when at rest otherwise may continue normal activity follow-up with gynecologist OB next week If this area worsens I recommend a follow-up ultrasound as well as follow-up visit with [place] for OB Currently anticoagulants are not indicated Patient was cautioned that blood clot could occur in the future although it is not highly likely and what warning signs to watch for

## 2018-09-09 ENCOUNTER — Other Ambulatory Visit: Payer: 59

## 2018-09-12 DIAGNOSIS — R102 Pelvic and perineal pain: Secondary | ICD-10-CM | POA: Diagnosis not present

## 2018-09-12 DIAGNOSIS — Z3A26 26 weeks gestation of pregnancy: Secondary | ICD-10-CM | POA: Diagnosis not present

## 2018-09-16 DIAGNOSIS — Z23 Encounter for immunization: Secondary | ICD-10-CM | POA: Diagnosis not present

## 2018-11-17 DIAGNOSIS — Z3685 Encounter for antenatal screening for Streptococcus B: Secondary | ICD-10-CM | POA: Diagnosis not present

## 2018-11-19 LAB — OB RESULTS CONSOLE GBS: GBS: NEGATIVE

## 2018-12-02 ENCOUNTER — Encounter (HOSPITAL_COMMUNITY): Payer: Self-pay | Admitting: *Deleted

## 2018-12-02 ENCOUNTER — Telehealth (HOSPITAL_COMMUNITY): Payer: Self-pay | Admitting: *Deleted

## 2018-12-02 NOTE — Telephone Encounter (Signed)
Preadmission screen  

## 2018-12-03 ENCOUNTER — Telehealth (HOSPITAL_COMMUNITY): Payer: Self-pay | Admitting: *Deleted

## 2018-12-03 NOTE — Telephone Encounter (Signed)
Preadmission screen  

## 2018-12-04 ENCOUNTER — Encounter (HOSPITAL_COMMUNITY): Payer: Self-pay | Admitting: *Deleted

## 2018-12-04 ENCOUNTER — Telehealth (HOSPITAL_COMMUNITY): Payer: Self-pay | Admitting: *Deleted

## 2018-12-04 NOTE — Telephone Encounter (Signed)
Preadmission screen  

## 2018-12-07 ENCOUNTER — Other Ambulatory Visit: Payer: Self-pay

## 2018-12-07 ENCOUNTER — Other Ambulatory Visit (HOSPITAL_COMMUNITY)
Admission: RE | Admit: 2018-12-07 | Discharge: 2018-12-07 | Disposition: A | Discharge status: Home / Self Care | Payer: BC Managed Care – PPO | Source: Ambulatory Visit | Attending: Obstetrics and Gynecology | Admitting: Obstetrics and Gynecology

## 2018-12-07 DIAGNOSIS — Z01812 Encounter for preprocedural laboratory examination: Secondary | ICD-10-CM | POA: Diagnosis not present

## 2018-12-07 DIAGNOSIS — Z20828 Contact with and (suspected) exposure to other viral communicable diseases: Secondary | ICD-10-CM | POA: Insufficient documentation

## 2018-12-07 LAB — SARS CORONAVIRUS 2 (TAT 6-24 HRS): SARS Coronavirus 2: NEGATIVE

## 2018-12-07 NOTE — Progress Notes (Signed)
Patient here for PAT for COVID. Denies symptoms. Swab collected.

## 2018-12-08 ENCOUNTER — Other Ambulatory Visit: Payer: Self-pay | Admitting: Obstetrics and Gynecology

## 2018-12-08 NOTE — H&P (Signed)
Elizabeth Moon is a 38 y.o. female [id number] at 21 1/7 weeks (EDD 12/15/18 byLMP c/w 10 week [place])  presenting for IOL at term.  Prenatal care uncomplicated except AMA, declined genetic screening. She has a h/o PCOS but HgbA1C WNL and passed one hour GCT.      OB History    Gravida  5   Para  3   Term  3   Preterm      AB  1   Living  3     SAB  1   TAB      Ectopic      Multiple  0   Live Births  3         04-07-2010, 41 wks 1. F, 7lbs 13oz, NSVD 05-23-2011, 5 wks SAB 09-11-2012 1. 8lbs 8oz, Vaginal Delivery 12-18-2015, 39.4 wks 1. M, 7lbs 5oz, Vaginal Delivery  Past Medical History:  Diagnosis Date  . Abnormal Pap smear   . Anemia   . Arthritis   . Fibromyalgia   . Fibromyalgia   . Headache(784.0)   . Hypothyroidism   . Insulin resistance   . PCOS (polycystic ovarian syndrome)   . Rheumatoid arthritis [facility])    Past Surgical History:  Procedure Laterality Date  . APPENDECTOMY    . TONSILLECTOMY    . WISDOM TOOTH EXTRACTION     Family History: family history includes Alzheimer's disease in her maternal grandmother; Cancer in her maternal grandmother; Depression in her maternal aunt, maternal grandmother, and mother; Diabetes in her paternal grandfather and paternal grandmother; Stroke in her maternal grandmother and paternal grandfather. Social History:  reports that she has quit smoking. She has never used smokeless tobacco. She reports that she does not drink alcohol or use drugs.     Maternal Diabetes: No Genetic Screening: Declined Maternal Ultrasounds/Referrals: Normal Fetal Ultrasounds or other Referrals:  None Maternal Substance Abuse:  No Significant Maternal Medications:  None Significant Maternal Lab Results:  None Other Comments:  None  Review of Systems  Constitutional: Negative for fever.  Gastrointestinal: Negative for abdominal pain and nausea.  Neurological: Negative for headaches.   Maternal Medical History:  Reason for  admission: Nausea.  Contractions: Frequency: irregular.   Perceived severity is mild.    Fetal activity: Perceived fetal activity is normal.    Prenatal complications: AMA  Prenatal Complications - Diabetes: none.      Last menstrual period 03/10/2018, unknown if currently breastfeeding. Exam Physical Exam  Prenatal labs: ABO, Rh: A/Positive/-- (05/18 0000) Antibody: Negative (05/18 0000) Rubella: Immune (05/18 0000) RPR: Nonreactive (05/18 0000)  HBsAg: Negative (05/18 0000)  HIV: Non-reactive (05/18 0000)  GBS: Negative/-- (10/28 0000)  One hour GCT 108  Assessment/Plan: Pt for IOL at term.  Plan AROM and pitocin.  Epidural prn.  Logan Bores 12/08/2018, 10:34 AM

## 2018-12-09 ENCOUNTER — Other Ambulatory Visit: Payer: Self-pay

## 2018-12-09 ENCOUNTER — Inpatient Hospital Stay (HOSPITAL_COMMUNITY): Payer: BC Managed Care – PPO

## 2018-12-09 ENCOUNTER — Encounter (HOSPITAL_COMMUNITY): Payer: Self-pay | Admitting: *Deleted

## 2018-12-09 ENCOUNTER — Inpatient Hospital Stay (HOSPITAL_COMMUNITY)
Admission: AD | Admit: 2018-12-09 | Discharge: 2018-12-11 | DRG: 807 | Disposition: A | Discharge status: Home / Self Care | Payer: BC Managed Care – PPO | Attending: Obstetrics and Gynecology | Admitting: Obstetrics and Gynecology

## 2018-12-09 ENCOUNTER — Inpatient Hospital Stay (HOSPITAL_COMMUNITY): Payer: BC Managed Care – PPO | Admitting: Anesthesiology

## 2018-12-09 DIAGNOSIS — Z87891 Personal history of nicotine dependence: Secondary | ICD-10-CM | POA: Diagnosis not present

## 2018-12-09 DIAGNOSIS — O26893 Other specified pregnancy related conditions, third trimester: Secondary | ICD-10-CM | POA: Diagnosis not present

## 2018-12-09 DIAGNOSIS — O9902 Anemia complicating childbirth: Secondary | ICD-10-CM | POA: Diagnosis not present

## 2018-12-09 DIAGNOSIS — D649 Anemia, unspecified: Secondary | ICD-10-CM | POA: Diagnosis not present

## 2018-12-09 DIAGNOSIS — Z3A39 39 weeks gestation of pregnancy: Secondary | ICD-10-CM | POA: Diagnosis not present

## 2018-12-09 DIAGNOSIS — E669 Obesity, unspecified: Secondary | ICD-10-CM | POA: Diagnosis present

## 2018-12-09 DIAGNOSIS — O99214 Obesity complicating childbirth: Secondary | ICD-10-CM | POA: Diagnosis not present

## 2018-12-09 LAB — CBC
HCT: 37.6 % (ref 36.0–46.0)
Hemoglobin: 12.9 g/dL (ref 12.0–15.0)
MCH: 31.7 pg (ref 26.0–34.0)
MCHC: 34.3 g/dL (ref 30.0–36.0)
MCV: 92.4 fL (ref 80.0–100.0)
Platelets: 195 10*3/uL (ref 150–400)
RBC: 4.07 MIL/uL (ref 3.87–5.11)
RDW: 12 % (ref 11.5–15.5)
WBC: 9.8 10*3/uL (ref 4.0–10.5)
nRBC: 0 % (ref 0.0–0.2)

## 2018-12-09 LAB — ABO/RH: ABO/RH(D): A POS

## 2018-12-09 LAB — TYPE AND SCREEN
ABO/RH(D): A POS
Antibody Screen: NEGATIVE

## 2018-12-09 LAB — RPR: RPR Ser Ql: NONREACTIVE

## 2018-12-09 MED ORDER — LIDOCAINE HCL (PF) 1 % IJ SOLN
INTRAMUSCULAR | Status: DC | PRN
  Administered 2018-12-09 (×2): 4 mL via EPIDURAL

## 2018-12-09 MED ORDER — OXYTOCIN BOLUS FROM INFUSION
500.0000 mL | Freq: Once | INTRAVENOUS | Status: AC
  Administered 2018-12-09: 500 mL via INTRAVENOUS

## 2018-12-09 MED ORDER — WITCH HAZEL-GLYCERIN EX PADS: 1.0000 "application " | MEDICATED_PAD | CUTANEOUS | Status: DC | PRN

## 2018-12-09 MED ORDER — IBUPROFEN 600 MG PO TABS
600.0000 mg | ORAL_TABLET | Freq: Four times a day (QID) | ORAL | Status: DC
  Administered 2018-12-09 – 2018-12-11 (×6): 600 mg via ORAL
  Filled 2018-12-09 (×7): qty 1

## 2018-12-09 MED ORDER — EPHEDRINE 5 MG/ML INJ: 10.0000 mg | INTRAVENOUS | Status: DC | PRN

## 2018-12-09 MED ORDER — ACETAMINOPHEN 325 MG PO TABS: 650.0000 mg | ORAL_TABLET | ORAL | Status: DC | PRN

## 2018-12-09 MED ORDER — OXYTOCIN 40 UNITS IN NORMAL SALINE INFUSION - SIMPLE MED
1.0000 m[IU]/min | INTRAVENOUS | Status: DC
  Administered 2018-12-09: 2 m[IU]/min via INTRAVENOUS
  Filled 2018-12-09: qty 1000

## 2018-12-09 MED ORDER — TERBUTALINE SULFATE 1 MG/ML IJ SOLN: 0.2500 mg | Freq: Once | INTRAMUSCULAR | Status: DC | PRN

## 2018-12-09 MED ORDER — TETANUS-DIPHTH-ACELL PERTUSSIS 5-2.5-18.5 LF-MCG/0.5 IM SUSP: 0.5000 mL | Freq: Once | INTRAMUSCULAR | Status: DC

## 2018-12-09 MED ORDER — PHENYLEPHRINE 40 MCG/ML (10ML) SYRINGE FOR IV PUSH (FOR BLOOD PRESSURE SUPPORT): 80.0000 ug | PREFILLED_SYRINGE | INTRAVENOUS | Status: DC | PRN

## 2018-12-09 MED ORDER — OXYCODONE-ACETAMINOPHEN 5-325 MG PO TABS: 2.0000 | ORAL_TABLET | ORAL | Status: DC | PRN

## 2018-12-09 MED ORDER — LACTATED RINGERS IV SOLN: 500.0000 mL | Freq: Once | INTRAVENOUS | Status: DC

## 2018-12-09 MED ORDER — PRENATAL MULTIVITAMIN CH
1.0000 | ORAL_TABLET | Freq: Every day | ORAL | Status: DC
  Administered 2018-12-10: 1 via ORAL
  Filled 2018-12-09: qty 1

## 2018-12-09 MED ORDER — COCONUT OIL OIL
1.0000 "application " | TOPICAL_OIL | Status: DC | PRN
  Administered 2018-12-10: 1 via TOPICAL

## 2018-12-09 MED ORDER — SIMETHICONE 80 MG PO CHEW: 80.0000 mg | CHEWABLE_TABLET | ORAL | Status: DC | PRN

## 2018-12-09 MED ORDER — SODIUM CHLORIDE (PF) 0.9 % IJ SOLN
INTRAMUSCULAR | Status: DC | PRN
  Administered 2018-12-09: 12 mL/h via EPIDURAL

## 2018-12-09 MED ORDER — DIPHENHYDRAMINE HCL 25 MG PO CAPS: 25.0000 mg | ORAL_CAPSULE | Freq: Four times a day (QID) | ORAL | Status: DC | PRN

## 2018-12-09 MED ORDER — ONDANSETRON HCL 4 MG/2ML IJ SOLN: 4.0000 mg | INTRAMUSCULAR | Status: DC | PRN

## 2018-12-09 MED ORDER — SOD CITRATE-CITRIC ACID 500-334 MG/5ML PO SOLN: 30.0000 mL | ORAL | Status: DC | PRN

## 2018-12-09 MED ORDER — LACTATED RINGERS IV SOLN
INTRAVENOUS | Status: DC
  Administered 2018-12-09 (×2): via INTRAVENOUS

## 2018-12-09 MED ORDER — LIDOCAINE HCL (PF) 1 % IJ SOLN: 30.0000 mL | INTRAMUSCULAR | Status: DC | PRN

## 2018-12-09 MED ORDER — ACETAMINOPHEN 325 MG PO TABS
650.0000 mg | ORAL_TABLET | ORAL | Status: DC | PRN
  Administered 2018-12-10: 650 mg via ORAL
  Filled 2018-12-09: qty 2

## 2018-12-09 MED ORDER — ZOLPIDEM TARTRATE 5 MG PO TABS: 5.0000 mg | ORAL_TABLET | Freq: Every evening | ORAL | Status: DC | PRN

## 2018-12-09 MED ORDER — FENTANYL-BUPIVACAINE-NACL 0.5-0.125-0.9 MG/250ML-% EP SOLN
EPIDURAL | Status: AC
  Filled 2018-12-09: qty 250

## 2018-12-09 MED ORDER — LACTATED RINGERS IV SOLN
500.0000 mL | INTRAVENOUS | Status: DC | PRN
  Administered 2018-12-09: 500 mL via INTRAVENOUS

## 2018-12-09 MED ORDER — DIPHENHYDRAMINE HCL 50 MG/ML IJ SOLN: 12.5000 mg | INTRAMUSCULAR | Status: DC | PRN

## 2018-12-09 MED ORDER — SENNOSIDES-DOCUSATE SODIUM 8.6-50 MG PO TABS
2.0000 | ORAL_TABLET | ORAL | Status: DC
  Administered 2018-12-10 (×2): 2 via ORAL
  Filled 2018-12-09 (×2): qty 2

## 2018-12-09 MED ORDER — BENZOCAINE-MENTHOL 20-0.5 % EX AERO: 1.0000 "application " | INHALATION_SPRAY | CUTANEOUS | Status: DC | PRN

## 2018-12-09 MED ORDER — OXYCODONE-ACETAMINOPHEN 5-325 MG PO TABS: 1.0000 | ORAL_TABLET | ORAL | Status: DC | PRN

## 2018-12-09 MED ORDER — ONDANSETRON HCL 4 MG/2ML IJ SOLN: 4.0000 mg | Freq: Four times a day (QID) | INTRAMUSCULAR | Status: DC | PRN

## 2018-12-09 MED ORDER — DIBUCAINE (PERIANAL) 1 % EX OINT: 1.0000 "application " | TOPICAL_OINTMENT | CUTANEOUS | Status: DC | PRN

## 2018-12-09 MED ORDER — ONDANSETRON HCL 4 MG PO TABS: 4.0000 mg | ORAL_TABLET | ORAL | Status: DC | PRN

## 2018-12-09 MED ORDER — FENTANYL-BUPIVACAINE-NACL 0.5-0.125-0.9 MG/250ML-% EP SOLN: 12.0000 mL/h | EPIDURAL | Status: DC | PRN

## 2018-12-09 MED ORDER — OXYTOCIN 40 UNITS IN NORMAL SALINE INFUSION - SIMPLE MED: 2.5000 [IU]/h | INTRAVENOUS | Status: DC

## 2018-12-09 MED ORDER — FENTANYL CITRATE (PF) 100 MCG/2ML IJ SOLN: 50.0000 ug | INTRAMUSCULAR | Status: DC | PRN

## 2018-12-09 NOTE — Anesthesia Procedure Notes (Signed)
Epidural Patient location during procedure: OB Start time: 12/09/2018 12:54 PM End time: 12/09/2018 1:01 PM  Staffing Anesthesiologist: Josephine Igo, MD Performed: anesthesiologist   Preanesthetic Checklist Completed: patient identified, site marked, surgical consent, pre-op evaluation, timeout performed, IV checked, risks and benefits discussed and monitors and equipment checked  Epidural Patient position: sitting Prep: site prepped and draped and DuraPrep Patient monitoring: continuous pulse ox and blood pressure Approach: midline Location: L3-L4 Injection technique: LOR air  Needle:  Needle type: Tuohy  Needle gauge: 17 G Needle length: 9 cm and 9 Needle insertion depth: 5 cm cm Catheter type: closed end flexible Catheter size: 19 Gauge Catheter at skin depth: 10 cm Test dose: negative and Other  Assessment Events: blood not aspirated, injection not painful, no injection resistance, negative IV test and no paresthesia  Additional Notes Patient identified. Risks and benefits discussed including failed block, incomplete  Pain control, post dural puncture headache, nerve damage, paralysis, blood pressure Changes, nausea, vomiting, reactions to medications-both toxic and allergic and post Partum back pain. All questions were answered. Patient expressed understanding and wished to proceed. Sterile technique was used throughout procedure. Epidural site was Dressed with sterile barrier dressing. No paresthesias, signs of intravascular injection Or signs of intrathecal spread were encountered.  Patient was more comfortable after the epidural was dosed. Please see RN's note for documentation of vital signs and FHR which are stable. Reason for block:procedure for pain

## 2018-12-09 NOTE — Anesthesia Preprocedure Evaluation (Signed)
Anesthesia Evaluation  Patient identified by MRN, date of birth, ID band Patient awake    Reviewed: Allergy & Precautions, Patient's Chart, lab work & pertinent test results  Airway Mallampati: II  TM Distance: >3 FB Neck ROM: Full    Dental no notable dental hx. (+) Teeth Intact   Pulmonary former smoker,    Pulmonary exam normal breath sounds clear to auscultation       Cardiovascular negative cardio ROS Normal cardiovascular exam Rhythm:Regular Rate:Normal     Neuro/Psych  Headaches,  Neuromuscular disease negative psych ROS   GI/Hepatic Neg liver ROS, GERD  Medicated,  Endo/Other  Hypothyroidism Obesity  Renal/GU negative Renal ROS  negative genitourinary   Musculoskeletal  (+) Arthritis , Osteoarthritis,  Fibromyalgia -  Abdominal (+) + obese,   Peds  Hematology  (+) anemia ,   Anesthesia Other Findings   Reproductive/Obstetrics (+) Pregnancy                             Anesthesia Physical Anesthesia Plan  ASA: II  Anesthesia Plan: Epidural   Post-op Pain Management:    Induction:   PONV Risk Score and Plan:   Airway Management Planned: Natural Airway  Additional Equipment:   Intra-op Plan:   Post-operative Plan:   Informed Consent: I have reviewed the patients History and Physical, chart, labs and discussed the procedure including the risks, benefits and alternatives for the proposed anesthesia with the patient or authorized representative who has indicated his/her understanding and acceptance.       Plan Discussed with: Anesthesiologist  Anesthesia Plan Comments:         Anesthesia Quick Evaluation

## 2018-12-09 NOTE — Progress Notes (Signed)
Patient ID: Elizabeth Moon, female   DOB: 11-04-1980, 38 y.o.   MRN: [id number] Pt feeling minimal contractions FHR category 1  Cervix 50/2-3/-2 AROM clear  Pitocin per protocol Epidural prn

## 2018-12-10 LAB — CBC
HCT: 35.6 % — ABNORMAL LOW (ref 36.0–46.0)
Hemoglobin: 12 g/dL (ref 12.0–15.0)
MCH: 31.3 pg (ref 26.0–34.0)
MCHC: 33.7 g/dL (ref 30.0–36.0)
MCV: 93 fL (ref 80.0–100.0)
Platelets: 176 10*3/uL (ref 150–400)
RBC: 3.83 MIL/uL — ABNORMAL LOW (ref 3.87–5.11)
RDW: 12.1 % (ref 11.5–15.5)
WBC: 13 10*3/uL — ABNORMAL HIGH (ref 4.0–10.5)
nRBC: 0 % (ref 0.0–0.2)

## 2018-12-10 MED ORDER — PRENATAL MULTIVITAMIN CH: 1.0000 | ORAL_TABLET | Freq: Every day | ORAL | 3 refills | Status: DC

## 2018-12-10 MED ORDER — IBUPROFEN 600 MG PO TABS: 600.0000 mg | ORAL_TABLET | Freq: Four times a day (QID) | ORAL | 1 refills | Status: DC | PRN

## 2018-12-10 NOTE — Anesthesia Postprocedure Evaluation (Signed)
Anesthesia Post Note  Patient: Elizabeth Moon  Procedure(s) Performed: AN AD [place]     Patient location during evaluation: Mother Baby Anesthesia Type: Epidural Level of consciousness: awake, awake and alert and oriented Pain management: pain level controlled Vital Signs Assessment: post-procedure vital signs reviewed and stable Respiratory status: spontaneous breathing and respiratory function stable Cardiovascular status: blood pressure returned to baseline Postop Assessment: no headache, epidural receding, patient able to bend at knees, adequate PO intake, no backache, able to ambulate and no apparent nausea or vomiting Anesthetic complications: no    Last Vitals:  Vitals:   12/09/18 1815 12/10/18 0645  BP: 101/63 98/64  Pulse: 77 63  Resp: 16 18  Temp: 36.9 C 36.8 C  SpO2: 100% 100%    Last Pain:  Vitals:   12/10/18 0645  TempSrc: Oral  PainSc: 0-No pain   Pain Goal:                   Bufford Spikes

## 2018-12-10 NOTE — Lactation Note (Addendum)
This note was copied from a baby's chart. Lactation Consultation Note  Patient Name: Elizabeth Moon Date: 12/10/2018   P4, Baby 47 hours old and mother attempting to latch baby who has a tight suck. Mother is having discomfort.  Provided comfort gels and mother has coconut oil. Demonstrated how to perform chin tug which briefly provided improved comfort. Demonstrated how to latch baby in laid back position to increase comfort. Encouraged tummy time.  Attempted latching with a #24 NS but mother still had discomfort. Recommend post pumping after every other feeding and giving volume pumped to baby. Feed on demand with cues.  Goal 8-12+ times per day after first 24 hrs.  Place baby STS if not cueing.  Reviewed engorgement care and monitoring voids/stools.  Mother's nipples continued to be sore even after additional assistance with latching. She stated it was too sore to put baby back to breast.  Mother is wearing comfort gels and will continue to pump with DEBP.  Baby has tight suck.  Mother inquired about supplementation and donor milk was reviewed.  Mother accepted and signed consent. Milk was warmedand baby was given 7 ml with finger curved tip syringe with Producer, television/film/video. Mother appreciative of donor breast milk.      Maternal Data    Feeding Feeding Type: Breast Fed  LATCH Score Latch: Grasps breast easily, tongue down, lips flanged, rhythmical sucking.  Audible Swallowing: A few with stimulation  Type of Nipple: Everted at rest and after stimulation  Comfort (Breast/Nipple): Soft / non-tender  Hold (Positioning): Assistance needed to correctly position infant at breast and maintain latch.  LATCH Score: 8  Interventions    Lactation Tools Discussed/Used     Consult Status      Vivianne Master [facility] 12/10/2018, 10:14 AM

## 2018-12-10 NOTE — Progress Notes (Signed)
Post Partum Day 1 Subjective: no complaints, up ad lib, voiding, tolerating PO and nl lochia, pain controlled.  Possibly desires d/c to home today  Objective: Blood pressure 98/64, pulse 63, temperature 98.2 F (36.8 C), temperature source Oral, resp. rate 18, height 5\' 7"  (1.702 m), weight 96.2 kg, last menstrual period 03/10/2018, SpO2 100 %, unknown if currently breastfeeding.  Physical Exam:  General: alert and no distress Lochia: appropriate Uterine Fundus: firm   Recent Labs    12/09/18 0834 12/10/18 0639  HGB 12.9 12.0  HCT 37.6 35.6*    Assessment/Plan:  Plan for discharge tomorrow, Breastfeeding and Lactation consult [id number] D/C with Motrin and PNV, f/u 6 weeks   LOS: 1 day   Elizabeth Moon 12/10/2018, 7:36 AM

## 2018-12-10 NOTE — Discharge Summary (Addendum)
OB Discharge Summary     Patient Name: Elizabeth Moon DOB: Oct 03, 1980 MRN: [id number]  Date of admission: 12/09/2018 Delivering MD: Huel Cote   Date of discharge: 12/11/2018  Admitting diagnosis: pregnancy Intrauterine pregnancy: [redacted]w[redacted]d     Secondary diagnosis:  Active Problems:   NSVD (normal spontaneous vaginal delivery)   Indication for care in labor and delivery, antepartum      Discharge diagnosis: Term Pregnancy Delivered                                   Hospital course: IOL With Vaginal Delivery     38 y.o. yo [id number] at [redacted]w[redacted]d was admitted favorable at term on 12/09/2018. Patient had an uncomplicated labor course as follows:  Membrane Rupture Time/Date: 8:46 AM ,12/09/2018   Intrapartum Procedures: Episiotomy: None [1]                                         Lacerations:  None [1]  Patient had a delivery of a Viable infant. 12/09/2018  Information for the patient's newborn:  Brantley, Naser [[id number]]  Delivery Method: Vaginal, Spontaneous(Filed from Delivery Summary)     Pateint had an uncomplicated postpartum course.  She is ambulating, tolerating a regular diet, passing flatus, and urinating well. Patient is discharged home in stable condition on 12/11/2018   Physical exam  Vitals:   12/09/18 1700 12/09/18 1815 12/10/18 0645 12/10/18 0825  BP: 109/73 101/63 98/64 100/64  Pulse: 68 77 63 74  Resp: 18 16 18 18   Temp: 97.8 F (36.6 C) 98.4 F (36.9 C) 98.2 F (36.8 C) (!) 97 F (36.1 C)  TempSrc: Oral Oral Oral Oral  SpO2:  100% 100%   Weight:      Height:       General: alert and no distress Lochia: appropriate Uterine Fundus: firm  Labs: Lab Results  Component Value Date   WBC 13.0 (H) 12/10/2018   HGB 12.0 12/10/2018   HCT 35.6 (L) 12/10/2018   MCV 93.0 12/10/2018   PLT 176 12/10/2018   CMP Latest Ref Rng & Units 10/01/2009  Glucose 70 - 99 mg/dL 90  BUN 6 - 23 mg/dL 8  Creatinine 0.4 - 1.2 mg/dL 12/01/2009  Sodium 7.26 - 203 mEq/L  135  Potassium 3.5 - 5.1 mEq/L 3.4(L)  Chloride 96 - 112 mEq/L 106  CO2 19 - 32 mEq/L 24  Calcium 8.4 - 10.5 mg/dL 9.1    Discharge instruction: per After Visit Summary and "Baby and Me Booklet".  After visit meds:  Allergies as of 12/10/2018      Reactions   Latex Rash      Medication List    STOP taking these medications   meloxicam 15 MG tablet Commonly known as: MOBIC   ORTHO MICRONOR PO   sulfacetamide 10 % ophthalmic solution Commonly known as: BLEPH-10   tiZANidine 4 MG capsule Commonly known as: Zanaflex     TAKE these medications   famotidine 20 MG tablet Commonly known as: PEPCID Take 20 mg by mouth 2 (two) times daily. Reported on 06/27/2015   ibuprofen 600 MG tablet Commonly known as: ADVIL Take 1 tablet (600 mg total) by mouth every 6 (six) hours as needed. What changed:   medication strength  how much to take  when to  take this   ondansetron 4 MG disintegrating tablet Commonly known as: Zofran ODT Take one tablet by mouth every 6 hours as needed for vomiting.   prenatal multivitamin Tabs tablet Take 1 tablet by mouth daily at 12 noon.       Diet: routine diet  Activity: Advance as tolerated. Pelvic rest for 6 weeks.   Outpatient follow up:6 weeks Follow up Appt:No future appointments. Follow up Visit:No follow-ups on file.  Postpartum contraception: Undecided  Newborn Data: Live born female  Birth Weight: 8 lb 1.5 oz (3670 g) APGAR: 29, 9  Newborn Delivery   Birth date/time: 12/09/2018 15:22:00 Delivery type: Vaginal, Spontaneous      Baby Feeding: Breast Disposition:home with mother

## 2018-12-11 NOTE — Discharge Instructions (Signed)
As per discharge pamphlet °

## 2018-12-11 NOTE — Lactation Note (Signed)
This note was copied from a baby's chart. Lactation Consultation Note  Patient Name: Girl Ronalda Walpole [id number] Date: 12/11/2018 Reason for consult: Follow-up assessment   P4, Baby 82 hours old. Mothers nipples are cracked with positional stripes. Provided mother with comfort gels. Mother was giving baby donor milk but now due to shortage. Made mother aware of APNO to call her OB/GYN is nipples become worse.      Mother states she is hurting too much to pump baby back to breast. She wants to give it 24 hours more. Reminded mother to pump 8x per day minimum - q 3 hours. Reviewed engorgement care and monitoring voids/stools. Feed on demand with cues.  Goal 8-12+ times per day after first 24 hrs.  Place baby STS if not cueing.     Maternal Data Has patient been taught Hand Expression?: Yes Does the patient have breastfeeding experience prior to this delivery?: Yes  Feeding Feeding Type: Donor Breast Milk  LATCH Score                   Interventions Interventions: Comfort gels  Lactation Tools Discussed/Used     Consult Status Consult Status: Complete Date: 12/11/18    Vivianne Master [facility] 12/11/2018, 9:55 AM

## 2018-12-11 NOTE — Progress Notes (Signed)
PPD #2 Doing well Afeb, VSS Fundus firm D/c home 

## 2019-01-21 DIAGNOSIS — Z3009 Encounter for other general counseling and advice on contraception: Secondary | ICD-10-CM | POA: Diagnosis not present

## 2019-01-21 DIAGNOSIS — Z1389 Encounter for screening for other disorder: Secondary | ICD-10-CM | POA: Diagnosis not present

## 2019-02-23 ENCOUNTER — Encounter: Payer: Self-pay | Admitting: Family Medicine

## 2019-08-20 DIAGNOSIS — M545 Low back pain: Secondary | ICD-10-CM | POA: Diagnosis not present

## 2019-08-20 DIAGNOSIS — M25561 Pain in right knee: Secondary | ICD-10-CM | POA: Diagnosis not present

## 2019-09-15 ENCOUNTER — Other Ambulatory Visit: Payer: Self-pay

## 2019-09-15 ENCOUNTER — Ambulatory Visit
Admission: EM | Admit: 2019-09-15 | Discharge: 2019-09-15 | Disposition: A | Discharge status: Home / Self Care | Payer: BC Managed Care – PPO | Attending: Emergency Medicine | Admitting: Emergency Medicine

## 2019-09-15 DIAGNOSIS — M542 Cervicalgia: Secondary | ICD-10-CM

## 2019-09-15 MED ORDER — CYCLOBENZAPRINE HCL 5 MG PO TABS: 5.0000 mg | ORAL_TABLET | Freq: Three times a day (TID) | ORAL | 0 refills | Status: DC | PRN

## 2019-09-15 MED ORDER — PREDNISONE 10 MG PO TABS: 20.0000 mg | ORAL_TABLET | Freq: Every day | ORAL | 0 refills | Status: DC

## 2019-09-15 MED ORDER — IBUPROFEN 800 MG PO TABS: 800.0000 mg | ORAL_TABLET | Freq: Three times a day (TID) | ORAL | 0 refills | Status: DC
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
## 2019-09-15 NOTE — Discharge Instructions (Addendum)
Rest, ice and heat as needed Ensure adequate range of motion as tolerated. Injuries all appear to be muscular in nature at this time Rest, ice and heat as needed Ensure adequate ROM as tolerated. Prescribed ibuprofen as needed for pain relief Prescribed flexeril as needed at bedtime for muscle spasm.  Do not drive or operate heavy machinery while taking this medication.  Prescribe low-dose steroid..  It may take 3-4 weeks for complete resolution of symptoms Follow-up with PCP Return here or go to ER if you have any new or worsening symptoms such as numbness/tingling of the inner thighs, loss of bladder or bowel control, headache/blurry vision, nausea/vomiting, confusion/altered mental status, dizziness, weakness, passing out, imbalance, etc..Marland Kitchen

## 2019-09-25 DIAGNOSIS — S139XXA Sprain of joints and ligaments of unspecified parts of neck, initial encounter: Secondary | ICD-10-CM | POA: Diagnosis not present

## 2019-10-02 DIAGNOSIS — M542 Cervicalgia: Secondary | ICD-10-CM | POA: Diagnosis not present

## 2019-10-07 DIAGNOSIS — M5000 Cervical disc disorder with myelopathy, unspecified cervical region: Secondary | ICD-10-CM | POA: Diagnosis not present

## 2019-11-04 DIAGNOSIS — M50122 Cervical disc disorder at C5-C6 level with radiculopathy: Secondary | ICD-10-CM | POA: Diagnosis not present

## 2019-11-04 DIAGNOSIS — M542 Cervicalgia: Secondary | ICD-10-CM | POA: Diagnosis not present

## 2019-11-04 DIAGNOSIS — M50123 Cervical disc disorder at C6-C7 level with radiculopathy: Secondary | ICD-10-CM | POA: Diagnosis not present

## 2019-11-11 DIAGNOSIS — M50123 Cervical disc disorder at C6-C7 level with radiculopathy: Secondary | ICD-10-CM | POA: Diagnosis not present

## 2019-11-11 DIAGNOSIS — M50122 Cervical disc disorder at C5-C6 level with radiculopathy: Secondary | ICD-10-CM | POA: Diagnosis not present

## 2019-11-11 DIAGNOSIS — M542 Cervicalgia: Secondary | ICD-10-CM | POA: Diagnosis not present

## 2019-11-13 DIAGNOSIS — M50123 Cervical disc disorder at C6-C7 level with radiculopathy: Secondary | ICD-10-CM | POA: Diagnosis not present

## 2019-11-13 DIAGNOSIS — M50122 Cervical disc disorder at C5-C6 level with radiculopathy: Secondary | ICD-10-CM | POA: Diagnosis not present

## 2019-11-13 DIAGNOSIS — M542 Cervicalgia: Secondary | ICD-10-CM | POA: Diagnosis not present

## 2019-11-27 DIAGNOSIS — M542 Cervicalgia: Secondary | ICD-10-CM | POA: Diagnosis not present

## 2019-11-27 DIAGNOSIS — M50122 Cervical disc disorder at C5-C6 level with radiculopathy: Secondary | ICD-10-CM | POA: Diagnosis not present

## 2019-11-27 DIAGNOSIS — M50123 Cervical disc disorder at C6-C7 level with radiculopathy: Secondary | ICD-10-CM | POA: Diagnosis not present

## 2019-12-10 DIAGNOSIS — M542 Cervicalgia: Secondary | ICD-10-CM | POA: Diagnosis not present

## 2019-12-10 DIAGNOSIS — M50123 Cervical disc disorder at C6-C7 level with radiculopathy: Secondary | ICD-10-CM | POA: Diagnosis not present

## 2019-12-10 DIAGNOSIS — M50122 Cervical disc disorder at C5-C6 level with radiculopathy: Secondary | ICD-10-CM | POA: Diagnosis not present

## 2019-12-14 DIAGNOSIS — M542 Cervicalgia: Secondary | ICD-10-CM | POA: Diagnosis not present

## 2019-12-14 DIAGNOSIS — M50122 Cervical disc disorder at C5-C6 level with radiculopathy: Secondary | ICD-10-CM | POA: Diagnosis not present

## 2019-12-14 DIAGNOSIS — M50123 Cervical disc disorder at C6-C7 level with radiculopathy: Secondary | ICD-10-CM | POA: Diagnosis not present

## 2019-12-21 DIAGNOSIS — M542 Cervicalgia: Secondary | ICD-10-CM | POA: Diagnosis not present

## 2019-12-21 DIAGNOSIS — M50122 Cervical disc disorder at C5-C6 level with radiculopathy: Secondary | ICD-10-CM | POA: Diagnosis not present

## 2019-12-21 DIAGNOSIS — M50123 Cervical disc disorder at C6-C7 level with radiculopathy: Secondary | ICD-10-CM | POA: Diagnosis not present

## 2019-12-28 DIAGNOSIS — M50122 Cervical disc disorder at C5-C6 level with radiculopathy: Secondary | ICD-10-CM | POA: Diagnosis not present

## 2019-12-28 DIAGNOSIS — M542 Cervicalgia: Secondary | ICD-10-CM | POA: Diagnosis not present

## 2019-12-28 DIAGNOSIS — M50123 Cervical disc disorder at C6-C7 level with radiculopathy: Secondary | ICD-10-CM | POA: Diagnosis not present

## 2019-12-30 ENCOUNTER — Other Ambulatory Visit: Payer: Self-pay | Admitting: Sports Medicine

## 2019-12-30 DIAGNOSIS — M542 Cervicalgia: Secondary | ICD-10-CM

## 2020-01-06 ENCOUNTER — Other Ambulatory Visit: Payer: BC Managed Care – PPO

## 2020-01-07 DIAGNOSIS — L814 Other melanin hyperpigmentation: Secondary | ICD-10-CM | POA: Diagnosis not present

## 2020-01-07 DIAGNOSIS — D485 Neoplasm of uncertain behavior of skin: Secondary | ICD-10-CM | POA: Diagnosis not present

## 2020-01-07 DIAGNOSIS — D225 Melanocytic nevi of trunk: Secondary | ICD-10-CM | POA: Diagnosis not present

## 2020-01-07 DIAGNOSIS — L988 Other specified disorders of the skin and subcutaneous tissue: Secondary | ICD-10-CM | POA: Diagnosis not present

## 2020-01-07 DIAGNOSIS — M542 Cervicalgia: Secondary | ICD-10-CM | POA: Diagnosis not present

## 2020-01-07 DIAGNOSIS — D2272 Melanocytic nevi of left lower limb, including hip: Secondary | ICD-10-CM | POA: Diagnosis not present

## 2020-01-07 DIAGNOSIS — M50122 Cervical disc disorder at C5-C6 level with radiculopathy: Secondary | ICD-10-CM | POA: Diagnosis not present

## 2020-01-07 DIAGNOSIS — D1801 Hemangioma of skin and subcutaneous tissue: Secondary | ICD-10-CM | POA: Diagnosis not present

## 2020-01-07 DIAGNOSIS — M50123 Cervical disc disorder at C6-C7 level with radiculopathy: Secondary | ICD-10-CM | POA: Diagnosis not present

## 2020-01-11 ENCOUNTER — Other Ambulatory Visit: Payer: Self-pay

## 2020-01-11 ENCOUNTER — Ambulatory Visit
Admission: RE | Admit: 2020-01-11 | Discharge: 2020-01-11 | Disposition: A | Discharge status: Home / Self Care | Payer: BC Managed Care – PPO | Source: Ambulatory Visit | Attending: Sports Medicine | Admitting: Sports Medicine

## 2020-01-11 DIAGNOSIS — M542 Cervicalgia: Secondary | ICD-10-CM

## 2020-01-11 MED ORDER — IOPAMIDOL (ISOVUE-M 300) INJECTION 61%
1.0000 mL | Freq: Once | INTRAMUSCULAR | Status: AC
  Administered 2020-01-11: 1 mL via EPIDURAL

## 2020-01-11 MED ORDER — METHYLPREDNISOLONE ACETATE 40 MG/ML INJ SUSP (RADIOLOG
120.0000 mg | Freq: Once | INTRAMUSCULAR | Status: AC
  Administered 2020-01-11: 120 mg via EPIDURAL

## 2020-01-11 NOTE — Discharge Instructions (Signed)

## 2020-01-28 DIAGNOSIS — Z1151 Encounter for screening for human papillomavirus (HPV): Secondary | ICD-10-CM | POA: Diagnosis not present

## 2020-01-28 DIAGNOSIS — Z01419 Encounter for gynecological examination (general) (routine) without abnormal findings: Secondary | ICD-10-CM | POA: Diagnosis not present

## 2020-01-28 DIAGNOSIS — Z130 Encounter for screening for diseases of the blood and blood-forming organs and certain disorders involving the immune mechanism: Secondary | ICD-10-CM | POA: Diagnosis not present

## 2020-01-28 DIAGNOSIS — Z124 Encounter for screening for malignant neoplasm of cervix: Secondary | ICD-10-CM | POA: Diagnosis not present

## 2020-01-28 DIAGNOSIS — Z6828 Body mass index (BMI) 28.0-28.9, adult: Secondary | ICD-10-CM | POA: Diagnosis not present

## 2020-01-28 DIAGNOSIS — Z3041 Encounter for surveillance of contraceptive pills: Secondary | ICD-10-CM | POA: Diagnosis not present

## 2020-02-24 ENCOUNTER — Ambulatory Visit (INDEPENDENT_AMBULATORY_CARE_PROVIDER_SITE_OTHER): Payer: BC Managed Care – PPO | Admitting: Nurse Practitioner

## 2020-02-24 ENCOUNTER — Other Ambulatory Visit: Payer: Self-pay

## 2020-02-24 ENCOUNTER — Encounter (INDEPENDENT_AMBULATORY_CARE_PROVIDER_SITE_OTHER): Payer: Self-pay | Admitting: Nurse Practitioner

## 2020-02-24 VITALS — BP 100/64 | HR 83 | Temp 98.1°F | Ht 66.5 in | Wt 179.4 lb

## 2020-02-24 DIAGNOSIS — M2550 Pain in unspecified joint: Secondary | ICD-10-CM | POA: Diagnosis not present

## 2020-02-24 DIAGNOSIS — E785 Hyperlipidemia, unspecified: Secondary | ICD-10-CM

## 2020-02-24 DIAGNOSIS — R5383 Other fatigue: Secondary | ICD-10-CM

## 2020-02-24 DIAGNOSIS — E559 Vitamin D deficiency, unspecified: Secondary | ICD-10-CM

## 2020-02-24 DIAGNOSIS — Z131 Encounter for screening for diabetes mellitus: Secondary | ICD-10-CM | POA: Diagnosis not present

## 2020-02-24 DIAGNOSIS — Z8639 Personal history of other endocrine, nutritional and metabolic disease: Secondary | ICD-10-CM | POA: Diagnosis not present

## 2020-02-24 NOTE — Progress Notes (Signed)
Subjective:  Patient ID: Elizabeth Moon, female    DOB: [birth year]  Age: 40 y.o. MRN: [id number]  CC:  Chief Complaint  Patient presents with  . Establish Care    Has not had labs in years, thinks hormones are changing, extreme fatigue, having a hard time loosing weight      HPI  This patient arrives today for the above.  She is here to establish care, and tells me she is never really gone to a primary care provider on a regular basis.  She has been going to see her OB/GYN on a regular basis as she has had multiple children over the last 10 years, but now would like to establish with a primary care provider.  Shows me she was told in the past that she has had a low vitamin D levels as well as elevated cholesterol.  She tells me she has been feeling quite fatigued lately and has been experiencing pain in pre much all of the joints of her fingers in both hands.  She tells me that the pain is worse in the morning and gets better during the day.  She has noticed that her PIP joints seem to have gotten bigger as she is not able to wear her rings like she used to.  She denies noticing any redness to the joints.  She also mentions some pain in bilateral knees.  Otherwise, she is generally feeling well.   Past Medical History:  Diagnosis Date  . Abnormal Pap smear   . Anemia   . Arthritis   . Fibromyalgia   . Fibromyalgia   . Headache(784.0)   . Hypothyroidism   . Insulin resistance   . PCOS (polycystic ovarian syndrome)   . Rheumatoid arthritis ([place])       Family History  Problem Relation Age of Onset  . Stroke Paternal Grandfather   . Diabetes Paternal Grandfather   . Depression Mother   . Depression Maternal Aunt   . Alzheimer's disease Maternal Grandmother   . Stroke Maternal Grandmother   . Depression Maternal Grandmother   . Cancer Maternal Grandmother        skin  . Diabetes Paternal Grandmother     Social History   Social History Narrative  . Not on file    Social History   Tobacco Use  . Smoking status: Former Research scientist (life sciences)  . Smokeless tobacco: Never Used  Substance Use Topics  . Alcohol use: No     Current Meds  Medication Sig  . ibuprofen (ADVIL) 800 MG tablet Take 1 tablet (800 mg total) by mouth 3 (three) times daily.  Marland Kitchen loratadine (CLARITIN) 10 MG tablet Take 10 mg by mouth daily.  . MULTIPLE VITAMIN PO Take 1 tablet by mouth daily.  . NORLYDA 0.35 MG tablet Take 1 tablet by mouth daily.    ROS:  Review of Systems  Constitutional: Positive for malaise/fatigue. Negative for diaphoresis, fever and weight loss.  Eyes: Negative for blurred vision.  Respiratory: Negative for shortness of breath.   Cardiovascular: Negative for chest pain.  Gastrointestinal: Negative for abdominal pain, blood in stool and melena.  Musculoskeletal: Positive for joint pain.       (+) joint swelling  Neurological: Positive for dizziness and headaches (with vertigo). Negative for loss of consciousness.  Psychiatric/Behavioral: The patient is nervous/anxious.      Objective:   Today's Vitals: BP 100/64   Pulse 83   Temp 98.1 F (36.7 C) (Temporal)  Ht 5' 6.5" (1.689 m)   Wt 179 lb 6.4 oz (81.4 kg)   SpO2 98%   BMI 28.52 kg/m  Vitals with BMI 02/24/2020 01/11/2020 09/15/2019  Height 5' 6.5" - -  Weight 179 lbs 6 oz - -  BMI 69.45 - -  Systolic 038 882 800  Diastolic 64 68 73  Pulse 83 61 75     Physical Exam Vitals reviewed.  Constitutional:      General: She is not in acute distress.    Appearance: Normal appearance.  HENT:     Head: Normocephalic and atraumatic.  Neck:     Vascular: No carotid bruit.  Cardiovascular:     Rate and Rhythm: Normal rate and regular rhythm.     Pulses: Normal pulses.     Heart sounds: Normal heart sounds.  Pulmonary:     Effort: Pulmonary effort is normal.     Breath sounds: Normal breath sounds.  Skin:    General: Skin is warm and dry.  Neurological:     General: No focal deficit present.      Mental Status: She is alert and oriented to person, place, and time.  Psychiatric:        Mood and Affect: Mood normal.        Behavior: Behavior normal.        Judgment: Judgment normal.          Assessment and Plan   1. Arthralgia, unspecified joint   2. Hyperlipidemia, unspecified hyperlipidemia type   3. Fatigue, unspecified type   4. Screening for diabetes mellitus   5. Vitamin D deficiency      Plan: 1.-5.  We will collect blood work for further evaluation today.  Further recommendations be made based upon these results.   Tests ordered Orders Placed This Encounter  Procedures  . CBC with Differential/Platelets  . CMP with eGFR([facility])  . Lipid Panel  . Hemoglobin A1c  . TSH  . T3, Free  . T4, Free  . Vitamin D, 25-hydroxy  . ANA  . Cyclic citrul peptide antibody, IgG  . Rheumatoid Factor      No orders of the defined types were placed in this encounter.   Patient to follow-up in approximately 1 month for her annual physical exam.  Ailene Ards, NP

## 2020-02-26 LAB — HEMOGLOBIN A1C
Hgb A1c MFr Bld: 5.2 % of total Hgb (ref <5.7)
Mean Plasma Glucose: 103 mg/dL
eAG (mmol/L): 5.7 mmol/L

## 2020-02-26 LAB — ANTI-NUCLEAR AB-TITER (ANA TITER)
ANA TITER: 1:80 {titer} — ABNORMAL HIGH
ANA Titer 1: 1:80 {titer} — ABNORMAL HIGH

## 2020-02-26 LAB — LIPID PANEL
Cholesterol: 314 mg/dL — ABNORMAL HIGH (ref <200)
HDL: 67 mg/dL (ref >=50)
LDL Cholesterol (Calc): 229 mg/dL (calc) — ABNORMAL HIGH
Non-HDL Cholesterol (Calc): 247 mg/dL (calc) — ABNORMAL HIGH (ref <130)
Total CHOL/HDL Ratio: 4.7 (calc) (ref <5.0)
Triglycerides: 72 mg/dL (ref <150)

## 2020-02-26 LAB — COMPLETE METABOLIC PANEL WITH GFR
AG Ratio: 2.6 (calc) — ABNORMAL HIGH (ref 1.0–2.5)
ALT: 12 U/L (ref 6–29)
AST: 12 U/L (ref 10–30)
Albumin: 4.6 g/dL (ref 3.6–5.1)
Alkaline phosphatase (APISO): 56 U/L (ref 31–125)
BUN: 18 mg/dL (ref 7–25)
CO2: 30 mmol/L (ref 20–32)
Calcium: 9.7 mg/dL (ref 8.6–10.2)
Chloride: 102 mmol/L (ref 98–110)
Creat: 0.85 mg/dL (ref 0.50–1.10)
GFR, Est African American: 101 mL/min/{1.73_m2} (ref >=60)
GFR, Est Non African American: 87 mL/min/{1.73_m2} (ref >=60)
Globulin: 1.8 g/dL (calc) — ABNORMAL LOW (ref 1.9–3.7)
Glucose, Bld: 87 mg/dL (ref 65–139)
Potassium: 4.1 mmol/L (ref 3.5–5.3)
Sodium: 137 mmol/L (ref 135–146)
Total Bilirubin: 0.2 mg/dL (ref 0.2–1.2)
Total Protein: 6.4 g/dL (ref 6.1–8.1)

## 2020-02-26 LAB — CBC WITH DIFFERENTIAL/PLATELET
Absolute Monocytes: 605 cells/uL (ref 200–950)
Basophils Absolute: 40 cells/uL (ref 0–200)
Basophils Relative: 1.1 %
Eosinophils Absolute: 180 cells/uL (ref 15–500)
Eosinophils Relative: 5 %
HCT: 38.8 % (ref 35.0–45.0)
Hemoglobin: 13.4 g/dL (ref 11.7–15.5)
Lymphs Abs: 1105 cells/uL (ref 850–3900)
MCH: 31.8 pg (ref 27.0–33.0)
MCHC: 34.5 g/dL (ref 32.0–36.0)
MCV: 91.9 fL (ref 80.0–100.0)
MPV: 10.3 fL (ref 7.5–12.5)
Monocytes Relative: 16.8 %
Neutro Abs: 1670 cells/uL (ref 1500–7800)
Neutrophils Relative %: 46.4 %
Platelets: 262 10*3/uL (ref 140–400)
RBC: 4.22 10*6/uL (ref 3.80–5.10)
RDW: 11.4 % (ref 11.0–15.0)
Total Lymphocyte: 30.7 %
WBC: 3.6 10*3/uL — ABNORMAL LOW (ref 3.8–10.8)

## 2020-02-26 LAB — TSH: TSH: 3.17 mIU/L

## 2020-02-26 LAB — CYCLIC CITRUL PEPTIDE ANTIBODY, IGG: Cyclic Citrullin Peptide Ab: <16 UNITS

## 2020-02-26 LAB — T4, FREE: Free T4: 1 ng/dL (ref 0.8–1.8)

## 2020-02-26 LAB — RHEUMATOID FACTOR: Rheumatoid fact SerPl-aCnc: <14 IU/mL (ref <14)

## 2020-02-26 LAB — ANA: Anti Nuclear Antibody (ANA): POSITIVE — AB

## 2020-02-26 LAB — T3, FREE: T3, Free: 2.8 pg/mL (ref 2.3–4.2)

## 2020-02-26 LAB — VITAMIN D 25 HYDROXY (VIT D DEFICIENCY, FRACTURES): Vit D, 25-Hydroxy: 25 ng/mL — ABNORMAL LOW (ref 30–100)

## 2020-02-29 ENCOUNTER — Encounter (INDEPENDENT_AMBULATORY_CARE_PROVIDER_SITE_OTHER): Payer: Self-pay | Admitting: Nurse Practitioner

## 2020-02-29 ENCOUNTER — Other Ambulatory Visit (INDEPENDENT_AMBULATORY_CARE_PROVIDER_SITE_OTHER): Payer: Self-pay | Admitting: Nurse Practitioner

## 2020-02-29 DIAGNOSIS — R5383 Other fatigue: Secondary | ICD-10-CM

## 2020-02-29 DIAGNOSIS — M2550 Pain in unspecified joint: Secondary | ICD-10-CM

## 2020-03-23 ENCOUNTER — Ambulatory Visit (INDEPENDENT_AMBULATORY_CARE_PROVIDER_SITE_OTHER): Payer: BC Managed Care – PPO | Admitting: Internal Medicine

## 2020-03-23 ENCOUNTER — Encounter (INDEPENDENT_AMBULATORY_CARE_PROVIDER_SITE_OTHER): Payer: BC Managed Care – PPO | Admitting: Internal Medicine

## 2020-04-11 ENCOUNTER — Ambulatory Visit (INDEPENDENT_AMBULATORY_CARE_PROVIDER_SITE_OTHER): Payer: BC Managed Care – PPO | Admitting: Internal Medicine

## 2020-04-11 ENCOUNTER — Other Ambulatory Visit: Payer: Self-pay

## 2020-04-11 ENCOUNTER — Encounter (INDEPENDENT_AMBULATORY_CARE_PROVIDER_SITE_OTHER): Payer: Self-pay | Admitting: Internal Medicine

## 2020-04-11 VITALS — BP 110/66 | HR 68 | Temp 97.8°F | Ht 66.0 in | Wt 176.0 lb

## 2020-04-11 DIAGNOSIS — E559 Vitamin D deficiency, unspecified: Secondary | ICD-10-CM

## 2020-04-11 DIAGNOSIS — E785 Hyperlipidemia, unspecified: Secondary | ICD-10-CM | POA: Diagnosis not present

## 2020-04-11 DIAGNOSIS — E282 Polycystic ovarian syndrome: Secondary | ICD-10-CM

## 2020-04-11 DIAGNOSIS — R5383 Other fatigue: Secondary | ICD-10-CM

## 2020-04-11 MED ORDER — NP THYROID 60 MG PO TABS: 60.0000 mg | ORAL_TABLET | Freq: Every day | ORAL | 3 refills | Status: DC

## 2020-04-11 MED ORDER — METFORMIN HCL ER 500 MG PO TB24: 500.0000 mg | ORAL_TABLET | Freq: Two times a day (BID) | ORAL | 3 refills | Status: DC

## 2020-04-11 NOTE — Patient Instructions (Signed)
[facility] Dietary Recommendations for Weight Loss What to Avoid . Avoid added sugars o Often added sugar can be found in processed foods such as many condiments, dry cereals, cakes, cookies, chips, crisps, crackers, candies, sweetened drinks, etc.  o Read labels and AVOID/DECREASE use of foods with the following in their ingredient list: Sugar, fructose, high fructose corn syrup, sucrose, glucose, maltose, dextrose, molasses, cane sugar, brown sugar, any type of syrup, agave nectar, etc.   . Avoid snacking in between meals . Avoid foods made with flour o If you are going to eat food made with flour, choose those made with whole-grains; and, minimize your consumption as much as is tolerable . Avoid processed foods o These foods are generally stocked in the middle of the grocery store. Focus on shopping on the perimeter of the grocery.  . Avoid Meat  o We recommend following a plant-based diet at [facility]. Thus, we recommend avoiding meat as a general rule. Consider eating beans, legumes, eggs, and/or dairy products for regular protein sources o If you plan on eating meat limit to 4 ounces of meat at a time and choose lean options such as Fish, chicken, turkey. Avoid red meat intake such as pork and/or steak What to Include . Vegetables o GREEN LEAFY VEGETABLES: Kale, spinach, mustard greens, collard greens, cabbage, broccoli, etc. o OTHER: Asparagus, cauliflower, eggplant, carrots, peas, Brussel sprouts, tomatoes, bell peppers, zucchini, beets, cucumbers, etc. . Grains, seeds, and legumes o Beans: kidney beans, black eyed peas, garbanzo beans, black beans, pinto beans, etc. o Whole, unrefined grains: brown rice, barley, bulgur, oatmeal, etc. . Healthy fats  o Avoid highly processed fats such as vegetable oil o Examples of healthy fats: avocado, olives, virgin olive oil, dark chocolate (?72% Cocoa), nuts (peanuts, almonds, walnuts, cashews, pecans, etc.) . None to Low  Intake of Animal Sources of Protein o Meat sources: chicken, turkey, salmon, tuna. Limit to 4 ounces of meat at one time. o Consider limiting dairy sources, but when choosing dairy focus on: PLAIN Greek yogurt, cottage cheese, high-protein milk . Fruit o Choose berries  When to Eat . Intermittent Fasting: o Choosing not to eat for a specific time period, but DO FOCUS ON HYDRATION when fasting o Multiple Techniques: - Time Restricted Eating: eat 3 meals in a day, each meal lasting no more than 60 minutes, no snacks between meals - 16-18 hour fast: fast for 16 to 18 hours up to 7 days a week. Often suggested to start with 2-3 nonconsecutive days per week.  . Remember the time you sleep is counted as fasting.  . Examples of eating schedule: Fast from 7:00pm-11:00am. Eat between 11:00am-7:00pm.  - 24-hour fast: fast for 24 hours up to every other day. Often suggested to start with 1 day per week . Remember the time you sleep is counted as fasting . Examples of eating schedule:  o Eating day: eat 2-3 meals on your eating day. If doing 2 meals, each meal should last no more than 90 minutes. If doing 3 meals, each meal should last no more than 60 minutes. Finish last meal by 7:00pm. o Fasting day: Fast until 7:00pm.  o IF YOU FEEL UNWELL FOR ANY REASON/IN ANY WAY WHEN FASTING, STOP FASTING BY EATING A NUTRITIOUS SNACK OR LIGHT MEAL o ALWAYS FOCUS ON HYDRATION DURING FASTS - Acceptable Hydration sources: water, broths, tea/coffee (black tea/coffee is best but using a small amount of whole-fat dairy products in coffee/tea is acceptable).  -   Poor Hydration Sources: anything with sugar or artificial sweeteners added to it  These recommendations have been developed for patients that are actively receiving medical care from either Dr. Bruce Moon or Elizabeth Gray, DNP, NP-C at [facility]. These recommendations are developed for patients with specific medical conditions and are not meant to be  distributed or used by others that are not actively receiving care from either provider listed above at [facility]. It is not appropriate to participate in the above eating plans without proper medical supervision.   Reference: Fung, J. The obesity code. [place]: [place]; 2016.   

## 2020-04-11 NOTE — Progress Notes (Signed)
Metrics: Intervention Frequency ACO  Documented Smoking Status Yearly  Screened one or more times in 24 months  Cessation Counseling or  Active cessation medication Past 24 months  Past 24 months   Guideline developer: UpToDate (See UpToDate for funding source) Date Released: 2014       Wellness Office Visit  Subjective:  Patient ID: Elizabeth Moon, female    DOB: 1980/09/25  Age: 40 y.o. MRN: [id number]  CC: This lady was due to be scheduled to have an annual physical today but she was not expecting this so we decided to convert this into a follow-up visit to review her blood work and further recommendations. HPI  She has been diagnosed many years ago by her OB/GYN with PCOS.  Apparently cysts were found on her ovaries.  Not sure what testing she had done but regardless the diagnosis was made and she does have a history of miscarriage, irregular periods, acne.  She also in the past was diagnosed with underactive thyroid but I do not think there was any follow-up.  Regarding her symptoms, she feels very fatigued, poor focus and concentration, inability to lose weight as she expected, tendency towards constipation and some hair loss.  As far as recent blood work, free T3 levels are suboptimal. Blood work also showed vitamin D deficiency. She also has hypercholesterolemia with significantly elevated total cholesterol above 300.  She does not have a history of coronary artery disease or cerebrovascular disease. She is due to see rheumatology in June for joint pains. Past Medical History:  Diagnosis Date  . Abnormal Pap smear   . Anemia   . Arthritis   . Fibromyalgia   . Fibromyalgia   . Headache(784.0)   . Hypothyroidism 2010  . Insulin resistance   . PCOS (polycystic ovarian syndrome) Age 40s  . Rheumatoid arthritis [facility])    Past Surgical History:  Procedure Laterality Date  . APPENDECTOMY    . TONSILLECTOMY    . WISDOM TOOTH EXTRACTION       Family History  Problem Relation  Age of Onset  . Stroke Paternal Grandfather   . Diabetes Paternal Grandfather   . Depression Mother   . Depression Maternal Aunt   . Alzheimer's disease Maternal Grandmother   . Stroke Maternal Grandmother   . Depression Maternal Grandmother   . Cancer Maternal Grandmother        skin  . 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   Social History   Tobacco Use  . Smoking status: Former Games developer  . Smokeless tobacco: Never Used  Substance Use Topics  . Alcohol use: No    Current Meds  Medication Sig  . loratadine (CLARITIN) 10 MG tablet Take 10 mg by mouth daily.  . metFORMIN (GLUCOPHAGE-XR) 500 MG 24 hr tablet Take 1 tablet (500 mg total) by mouth in the morning and at bedtime.  . MULTIPLE VITAMIN PO Take 1 tablet by mouth daily.  . NORLYDA 0.35 MG tablet Take 1 tablet by mouth daily.  . NP THYROID 60 MG tablet Take 1 tablet (60 mg total) by mouth daily before breakfast.     Flowsheet Row Office Visit from 02/24/2020 in [facility]  PHQ-9 Total Score 0      Objective:   Today's Vitals: BP 110/66 (BP Location: Right Arm, Patient Position: Sitting, Cuff Size: Normal)  Pulse 68   Temp 97.8 F (36.6 C) (Temporal)   Ht 5\' 6"  (1.676 m)   Wt 176 lb (79.8 kg)   SpO2 98%   BMI 28.41 kg/m  Vitals with BMI 04/11/2020 02/24/2020 01/11/2020  Height 5\' 6"  5' 6.5" -  Weight 176 lbs 179 lbs 6 oz -  BMI 28.42 28.53 -  Systolic 110 100 01/13/2020  Diastolic 66 64 68  Pulse 68 83 61     Physical Exam   She looks systemically well.  She is overweight.  Blood pressure is in a good range.    Assessment   1. Hyperlipidemia, unspecified hyperlipidemia type   2. Vitamin D deficiency   3. Fatigue, unspecified type   4. PCOS (polycystic ovarian syndrome)       Tests ordered Orders Placed This Encounter  Procedures  . Ambulatory referral to  Obstetrics / Gynecology     Plan: 1. I recommended she start taking vitamin D3 10,000 units daily which she already may be.  She showed me the bottle that she is taking. 2. To help with her PCOS and insulin resistance, we will start her on Metformin.  I told her about possible side effects. 3. In regard to her thyroid function, she clearly has symptoms of thyroid hypofunction despite normal tests and after shared decision making, we agreed to start her on NP thyroid 60 mg daily for her symptoms.  I explained possible side effects and how to deal with them. 4. We discussed nutrition in detail and the concept of intermittent fasting combined with a plant-based diet, ensuring hydration. 5. I will refer to Dr. for GYN. 6. I will see her in about 6 weeks time for follow-up to see how she is feeling then. 7. Today I spent approximately 50 minutes with this patient explaining her blood work and formulating a plan above.   Meds ordered this encounter  Medications  . NP THYROID 60 MG tablet    Sig: Take 1 tablet (60 mg total) by mouth daily before breakfast.    Dispense:  30 tablet    Refill:  3  . metFORMIN (GLUCOPHAGE-XR) 500 MG 24 hr tablet    Sig: Take 1 tablet (500 mg total) by mouth in the morning and at bedtime.    Dispense:  60 tablet    Refill:  3    Nimish [id number], MD

## 2020-05-23 ENCOUNTER — Encounter (INDEPENDENT_AMBULATORY_CARE_PROVIDER_SITE_OTHER): Payer: Self-pay | Admitting: Internal Medicine

## 2020-05-23 ENCOUNTER — Ambulatory Visit (INDEPENDENT_AMBULATORY_CARE_PROVIDER_SITE_OTHER): Payer: BC Managed Care – PPO | Admitting: Internal Medicine

## 2020-05-23 ENCOUNTER — Other Ambulatory Visit: Payer: Self-pay

## 2020-05-23 VITALS — BP 118/66 | HR 79 | Temp 97.9°F | Ht 66.0 in | Wt 171.4 lb

## 2020-05-23 DIAGNOSIS — E282 Polycystic ovarian syndrome: Secondary | ICD-10-CM | POA: Diagnosis not present

## 2020-05-23 DIAGNOSIS — E559 Vitamin D deficiency, unspecified: Secondary | ICD-10-CM | POA: Diagnosis not present

## 2020-05-23 DIAGNOSIS — E785 Hyperlipidemia, unspecified: Secondary | ICD-10-CM

## 2020-05-23 DIAGNOSIS — R5383 Other fatigue: Secondary | ICD-10-CM

## 2020-05-23 NOTE — Progress Notes (Signed)
Metrics: Intervention Frequency ACO  Documented Smoking Status Yearly  Screened one or more times in 24 months  Cessation Counseling or  Active cessation medication Past 24 months  Past 24 months   Guideline developer: UpToDate (See UpToDate for funding source) Date Released: 2014       Wellness Office Visit  Subjective:  Patient ID: Elizabeth Moon, female    DOB: 1980/10/20  Age: 40 y.o. MRN: [id number]  CC: This lady comes in for follow-up of PCOS, thyroid hypofunction, dyslipidemia and vitamin D deficiency. HPI  She has tolerated NP thyroid 60 mg daily and metformin and she feels much more energized, improved focus and concentration.  She has been also taking vitamin D3 10,000 units daily and her joints do not hurt as much as they used to. She is trying to do intermittent fasting although this has been challenging and trying to eat more of a plant-based diet, again somewhat challenging for her but she still has managed to lose weight. Past Medical History:  Diagnosis Date  . Abnormal Pap smear   . Anemia   . Arthritis   . Fibromyalgia   . Fibromyalgia   . Headache(784.0)   . Hypothyroidism 2010  . Insulin resistance   . PCOS (polycystic ovarian syndrome) Age 28s  . Rheumatoid arthritis [facility])    Past Surgical History:  Procedure Laterality Date  . APPENDECTOMY    . TONSILLECTOMY    . WISDOM TOOTH EXTRACTION       Family History  Problem Relation Age of Onset  . Stroke Paternal Grandfather   . Diabetes Paternal Grandfather   . Depression Mother   . Depression Maternal Aunt   . Alzheimer's disease Maternal Grandmother   . Stroke Maternal Grandmother   . Depression Maternal Grandmother   . Cancer Maternal Grandmother        skin  . 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   Social History   Tobacco Use  . Smoking status: Former  Games developer  . Smokeless tobacco: Never Used  Substance Use Topics  . Alcohol use: No    Current Meds  Medication Sig  . Cholecalciferol (VITAMIN D3) 125 MCG (5000 UT) TABS Take 2 tablets by mouth daily.  Marland Kitchen loratadine (CLARITIN) 10 MG tablet Take 10 mg by mouth daily.  . metFORMIN (GLUCOPHAGE-XR) 500 MG 24 hr tablet Take 1 tablet (500 mg total) by mouth in the morning and at bedtime.  . MULTIPLE VITAMIN PO Take 1 tablet by mouth daily.  . NORLYDA 0.35 MG tablet Take 1 tablet by mouth daily.  . NP THYROID 60 MG tablet Take 1 tablet (60 mg total) by mouth daily before breakfast.     Flowsheet Row Office Visit from 05/23/2020 in [facility]  PHQ-9 Total Score 0      Objective:   Today's Vitals: BP 118/66   Pulse 79   Temp 97.9 F (36.6 C) (Temporal)   Ht 5\' 6"  (1.676 m)   Wt 171 lb 6.4 oz (77.7 kg)   SpO2 96%   BMI 27.66 kg/m  Vitals with BMI 05/23/2020 04/11/2020 02/24/2020  Height 5\' 6"  5\' 6"  5' 6.5"  Weight 171 lbs 6 oz 176 lbs 179 lbs 6 oz  BMI 27.68 28.42 28.53  Systolic 118 110 04/23/2020  Diastolic 66 66 64  Pulse 79 68 83  Physical Exam   She looks systemically well and has lost 5 pounds in weight since last visit.   Assessment   1. PCOS (polycystic ovarian syndrome)   2. Fatigue, unspecified type   3. Hyperlipidemia, unspecified hyperlipidemia type   4. Vitamin D deficiency       Tests ordered Orders Placed This Encounter  Procedures  . COMPLETE METABOLIC PANEL WITH GFR  . VITAMIN D 25 Hydroxy (Vit-D Deficiency, Fractures)  . T3, free  . TSH     Plan: 1. Continue with metformin and NP thyroid and we will check levels today. 2. Continue with vitamin D3 10,000 units daily and I will check levels today. 3. Further recommendations will depend on blood results and I will see him in about 2 months time for follow-up.  I have told her that we need to further optimize thyroid as indicated.  This will help reduce insulin resistance, which in turn will  help her PCOS   No orders of the defined types were placed in this encounter.   Wilson Singer, MD

## 2020-05-24 LAB — VITAMIN D 25 HYDROXY (VIT D DEFICIENCY, FRACTURES): Vit D, 25-Hydroxy: 73 ng/mL (ref 30–100)

## 2020-05-24 LAB — TSH: TSH: 1.31 mIU/L

## 2020-05-24 LAB — COMPLETE METABOLIC PANEL WITH GFR
AG Ratio: 2.8 (calc) — ABNORMAL HIGH (ref 1.0–2.5)
ALT: 11 U/L (ref 6–29)
AST: 13 U/L (ref 10–30)
Albumin: 4.8 g/dL (ref 3.6–5.1)
Alkaline phosphatase (APISO): 45 U/L (ref 31–125)
BUN: 13 mg/dL (ref 7–25)
CO2: 27 mmol/L (ref 20–32)
Calcium: 9.6 mg/dL (ref 8.6–10.2)
Chloride: 104 mmol/L (ref 98–110)
Creat: 0.61 mg/dL (ref 0.50–1.10)
GFR, Est African American: 133 mL/min/{1.73_m2} (ref >=60)
GFR, Est Non African American: 115 mL/min/{1.73_m2} (ref >=60)
Globulin: 1.7 g/dL (calc) — ABNORMAL LOW (ref 1.9–3.7)
Glucose, Bld: 78 mg/dL (ref 65–139)
Potassium: 3.9 mmol/L (ref 3.5–5.3)
Sodium: 139 mmol/L (ref 135–146)
Total Bilirubin: 0.4 mg/dL (ref 0.2–1.2)
Total Protein: 6.5 g/dL (ref 6.1–8.1)

## 2020-05-24 LAB — T3, FREE: T3, Free: 3.4 pg/mL (ref 2.3–4.2)

## 2020-05-25 ENCOUNTER — Other Ambulatory Visit (INDEPENDENT_AMBULATORY_CARE_PROVIDER_SITE_OTHER): Payer: Self-pay | Admitting: Internal Medicine

## 2020-05-25 MED ORDER — NP THYROID 90 MG PO TABS: 90.0000 mg | ORAL_TABLET | Freq: Every day | ORAL | 3 refills | Status: DC

## 2020-06-14 NOTE — Progress Notes (Deleted)
Office Visit Note  Patient: Elizabeth Moon             Date of Birth: [birth year]           MRN: [id number]             PCP: Wilson Singer, MD Referring: Elenore Paddy, NP Visit Date: 06/28/2020 Occupation: @GUAROCC @  Subjective:  No chief complaint on file.   History of Present Illness: Elizabeth Moon is a 40 y.o. female ***   Activities of Daily Living:  Patient reports morning stiffness for *** {minute/hour:19697}.   Patient {ACTIONS;DENIES/REPORTS:21021675::"Denies"} nocturnal pain.  Difficulty dressing/grooming: {ACTIONS;DENIES/REPORTS:21021675::"Denies"} Difficulty climbing stairs: {ACTIONS;DENIES/REPORTS:21021675::"Denies"} Difficulty getting out of chair: {ACTIONS;DENIES/REPORTS:21021675::"Denies"} Difficulty using hands for taps, buttons, cutlery, and/or writing: {ACTIONS;DENIES/REPORTS:21021675::"Denies"}  No Rheumatology ROS completed.   PMFS History:  Patient Active Problem List   Diagnosis Date Noted  . Indication for care in labor and delivery, antepartum 12/09/2018  . Term pregnancy 12/17/2015  . NSVD (normal spontaneous vaginal delivery) 09/11/2012    Past Medical History:  Diagnosis Date  . Abnormal Pap smear   . Anemia   . Arthritis   . Fibromyalgia   . Fibromyalgia   . Headache(784.0)   . Hypothyroidism 2010  . Insulin resistance   . PCOS (polycystic ovarian syndrome) Age 26s  . Rheumatoid arthritis (HCC)     Family History  Problem Relation Age of Onset  . Stroke Paternal Grandfather   . Diabetes Paternal Grandfather   . Depression Mother   . Depression Maternal Aunt   . Alzheimer's disease Maternal Grandmother   . Stroke Maternal Grandmother   . Depression Maternal Grandmother   . Cancer Maternal Grandmother        skin  . Diabetes Paternal Grandmother    Past Surgical History:  Procedure Laterality Date  . APPENDECTOMY    . TONSILLECTOMY    . 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   There is no immunization history for the selected administration types on file for this patient.   Objective: Vital Signs: There were no vitals taken for this visit.   Physical Exam   Musculoskeletal Exam: ***  CDAI Exam: CDAI Score: -- Patient Global: --; Provider Global: -- Swollen: --; Tender: -- Joint Exam 06/28/2020   No joint exam has been documented for this visit   There is currently no information documented on the homunculus. Go to the Rheumatology activity and complete the homunculus joint exam.  Investigation: No additional findings.  Imaging: No results found.  Recent Labs: Lab Results  Component Value Date   WBC 3.6 (L) 02/24/2020   HGB 13.4 02/24/2020   PLT 262 02/24/2020   NA 139 05/23/2020   K 3.9 05/23/2020   CL 104 05/23/2020   CO2 27 05/23/2020   GLUCOSE 78 05/23/2020   BUN 13 05/23/2020   CREATININE 0.61 05/23/2020   BILITOT 0.4 05/23/2020   AST 13 05/23/2020   ALT 11 05/23/2020   PROT 6.5 05/23/2020   CALCIUM 9.6 05/23/2020   GFRAA 133 05/23/2020    Speciality Comments: No specialty comments available.  Procedures:  No procedures performed Allergies: Latex   Assessment / Plan:     Visit Diagnoses: Polyarthralgia - 02/24/20: ANA 1:80 nuclear, discrete dots, 1:80 nuclear, dense fine speckled, TSH 3.17, T3 2.8, T4 1.0, vitamin D 25, anti-CCP<16, RF<14  Other fatigue  Orders: No orders of the defined types were placed in this encounter.  No orders of the defined types were placed in this encounter.   Face-to-face time spent with patient was *** minutes. Greater than 50% of time was spent in counseling and coordination of care.  Follow-Up Instructions: No follow-ups on file.   Gearldine Bienenstock, PA-C  Note - This record has been created using Dragon software.  Chart creation errors have been sought, but may not always  have been located. Such creation  errors do not reflect on  the standard of medical care.

## 2020-06-28 ENCOUNTER — Ambulatory Visit: Payer: BC Managed Care – PPO | Admitting: Rheumatology

## 2020-06-28 DIAGNOSIS — M2550 Pain in unspecified joint: Secondary | ICD-10-CM

## 2020-06-28 DIAGNOSIS — R5383 Other fatigue: Secondary | ICD-10-CM

## 2020-07-28 ENCOUNTER — Ambulatory Visit: Payer: BC Managed Care – PPO | Admitting: Rheumatology

## 2020-08-03 ENCOUNTER — Other Ambulatory Visit: Payer: Self-pay

## 2020-08-03 ENCOUNTER — Ambulatory Visit (INDEPENDENT_AMBULATORY_CARE_PROVIDER_SITE_OTHER): Payer: BC Managed Care – PPO | Admitting: Internal Medicine

## 2020-08-03 ENCOUNTER — Encounter (INDEPENDENT_AMBULATORY_CARE_PROVIDER_SITE_OTHER): Payer: Self-pay | Admitting: Internal Medicine

## 2020-08-03 VITALS — BP 108/66 | HR 72 | Temp 97.3°F | Ht 66.0 in | Wt 167.0 lb

## 2020-08-03 DIAGNOSIS — R5383 Other fatigue: Secondary | ICD-10-CM | POA: Diagnosis not present

## 2020-08-03 DIAGNOSIS — E785 Hyperlipidemia, unspecified: Secondary | ICD-10-CM

## 2020-08-03 DIAGNOSIS — E282 Polycystic ovarian syndrome: Secondary | ICD-10-CM | POA: Diagnosis not present

## 2020-08-03 NOTE — Progress Notes (Signed)
Metrics: Intervention Frequency ACO  Documented Smoking Status Yearly  Screened one or more times in 24 months  Cessation Counseling or  Active cessation medication Past 24 months  Past 24 months   Guideline developer: UpToDate (See UpToDate for funding source) Date Released: 2014       Wellness Office Visit  Subjective:  Patient ID: Elizabeth Moon, female    DOB: [birth year]  Age: 40 y.o. MRN: [id number]  CC: This lady comes in for follow-up of PCOS, significant hyperlipidemia and symptoms of thyroid hypofunction. HPI  She has not been as consistent with nutrition as she would like to be but she has tolerated the higher dose of NP thyroid 90 mg daily. She has changed jobs. Past Medical History:  Diagnosis Date   Abnormal Pap smear    Anemia    Arthritis    Fibromyalgia    Fibromyalgia    Headache(784.0)    Hypothyroidism 2010   Insulin resistance    PCOS (polycystic ovarian syndrome) Age 24s   Rheumatoid arthritis (HCC)    Past Surgical History:  Procedure Laterality Date   APPENDECTOMY     TONSILLECTOMY     WISDOM TOOTH EXTRACTION       Family History  Problem Relation Age of Onset   Stroke Paternal Grandfather    Diabetes Paternal Grandfather    Depression Mother    Depression Maternal Aunt    Alzheimer's disease Maternal Grandmother    Stroke Maternal Grandmother    Depression Maternal Grandmother    Cancer Maternal Grandmother        skin   Diabetes Paternal Grandmother     Social History   Social History Narrative   Married for 14 years.Lives with husband and 4 kids.Social Designer, industrial/product for [employer].   Social History   Tobacco Use   Smoking status: Former    Pack years: 0.00   Smokeless tobacco: Never  Substance Use Topics   Alcohol use: No    Current Meds  Medication Sig   Cholecalciferol (VITAMIN D3) 125 MCG (5000 UT) TABS Take 2 tablets by mouth daily.   loratadine (CLARITIN) 10 MG tablet Take 10 mg by mouth daily.   metFORMIN  (GLUCOPHAGE-XR) 500 MG 24 hr tablet Take 1 tablet (500 mg total) by mouth in the morning and at bedtime.   MULTIPLE VITAMIN PO Take 1 tablet by mouth daily.   NORLYDA 0.35 MG tablet Take 1 tablet by mouth daily.   NP THYROID 90 MG tablet Take 1 tablet (90 mg total) by mouth daily.     Flowsheet Row Office Visit from 08/03/2020 in [facility]  PHQ-9 Total Score 0       Objective:   Today's Vitals: BP 108/66   Pulse 72   Temp (!) 97.3 F (36.3 C) (Temporal)   Ht 5\' 6"  (1.676 m)   Wt 167 lb (75.8 kg)   SpO2 99%   BMI 26.95 kg/m  Vitals with BMI 08/03/2020 05/23/2020 04/11/2020  Height 5\' 6"  5\' 6"  5\' 6"   Weight 167 lbs 171 lbs 6 oz 176 lbs  BMI 26.97 27.68 28.42  Systolic 108 118 04/13/2020  Diastolic 66 66 66  Pulse 72 79 68     Physical Exam  She has lost 4 pounds since the last time I saw her.  Blood pressure is excellent.     Assessment   1. PCOS (polycystic ovarian syndrome)   2. Hyperlipidemia, unspecified hyperlipidemia type   3. Fatigue, unspecified type  Tests ordered Orders Placed This Encounter  Procedures   Lipid panel   T3, free   TSH      Plan: 1.  Continue to work on nutrition and exercise. 2.  Continue with the same dose of NP thyroid 90 mg daily and we will check thyroid function today.  We may need to adjust further. 3.  Check lipid panel today to see where we are now. 4.  I will see her in September for an annual physical exam.   No orders of the defined types were placed in this encounter.   Wilson Singer, MD

## 2020-08-04 ENCOUNTER — Other Ambulatory Visit (INDEPENDENT_AMBULATORY_CARE_PROVIDER_SITE_OTHER): Payer: Self-pay | Admitting: Internal Medicine

## 2020-08-04 LAB — LIPID PANEL
Cholesterol: 267 mg/dL — ABNORMAL HIGH (ref <200)
HDL: 68 mg/dL (ref >=50)
LDL Cholesterol (Calc): 185 mg/dL (calc) — ABNORMAL HIGH
Non-HDL Cholesterol (Calc): 199 mg/dL (calc) — ABNORMAL HIGH (ref <130)
Total CHOL/HDL Ratio: 3.9 (calc) (ref <5.0)
Triglycerides: 46 mg/dL (ref <150)

## 2020-08-04 LAB — T3, FREE: T3, Free: 3.7 pg/mL (ref 2.3–4.2)

## 2020-08-04 LAB — TSH: TSH: 0.93 mIU/L

## 2020-08-04 MED ORDER — NP THYROID 60 MG PO TABS: 60.0000 mg | ORAL_TABLET | Freq: Two times a day (BID) | ORAL | 3 refills | Status: DC

## 2020-08-07 ENCOUNTER — Other Ambulatory Visit (INDEPENDENT_AMBULATORY_CARE_PROVIDER_SITE_OTHER): Payer: Self-pay | Admitting: Internal Medicine

## 2020-10-11 ENCOUNTER — Encounter (INDEPENDENT_AMBULATORY_CARE_PROVIDER_SITE_OTHER): Payer: BC Managed Care – PPO | Admitting: Internal Medicine

## 2020-10-21 IMAGING — US RIGHT LOWER EXTREMITY VENOUS ULTRASOUND
1 series · 13 of 24 positions shown · non-contrast
Comparison: None.

CLINICAL DATA: 38-year-old female with a history swelling



[Series 1: right lower extremity venous ultrasound · 0.07mm/px · 13 of 64 slices shown]
[im 1/64]
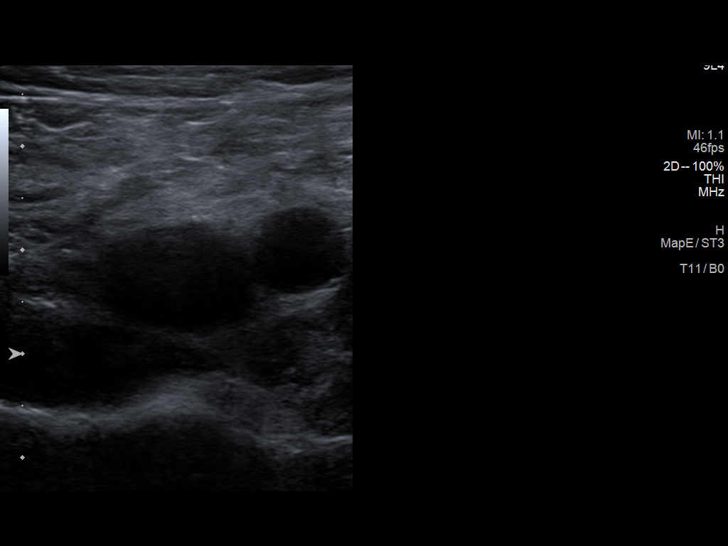
[im 6/64]
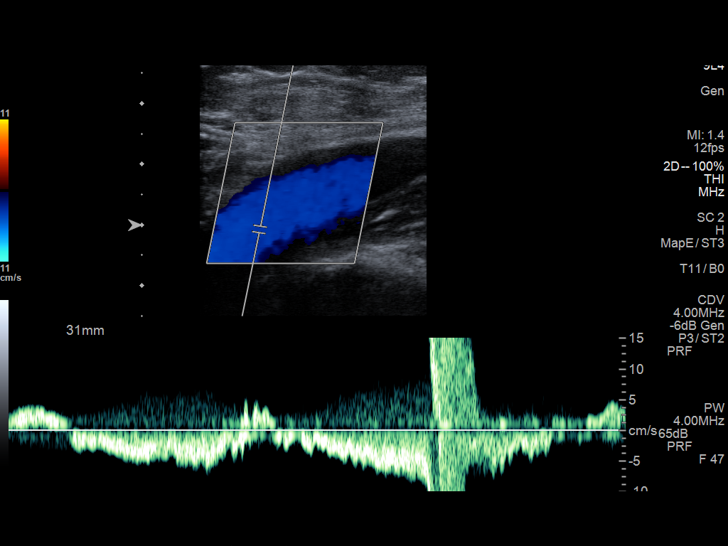
[im 11/64]
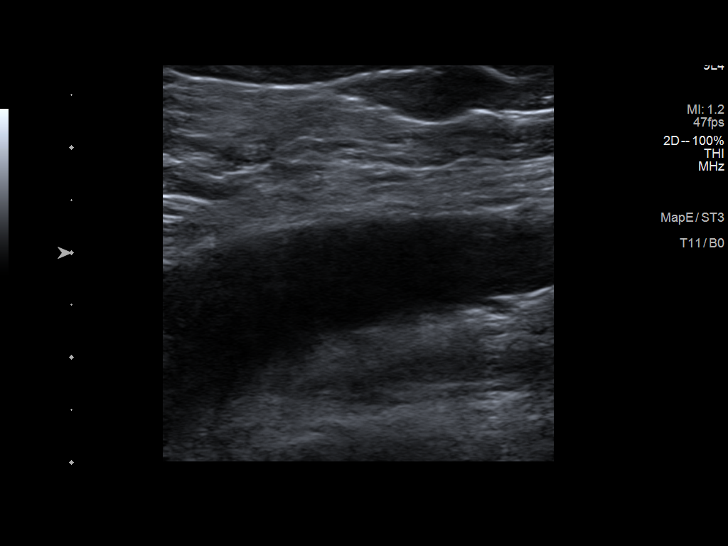
[im 17/64]
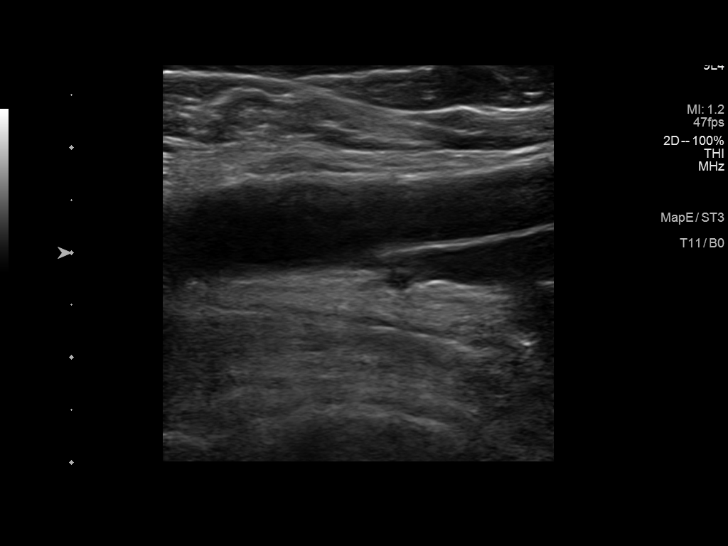
[im 22/64]
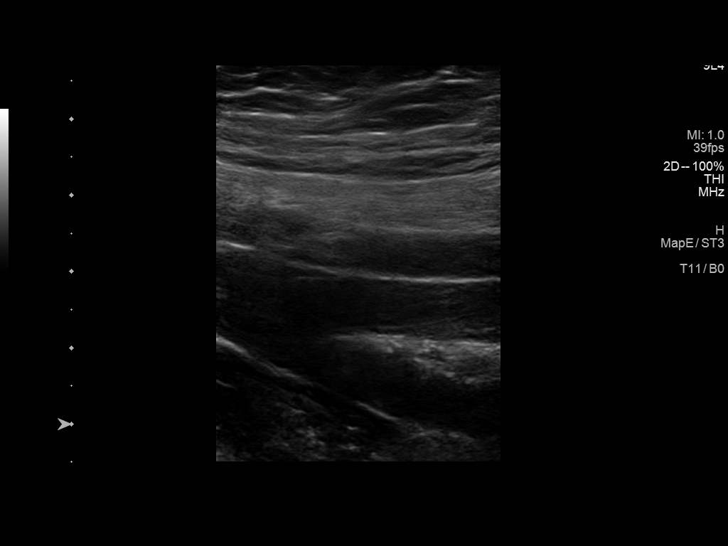
[im 28/64]
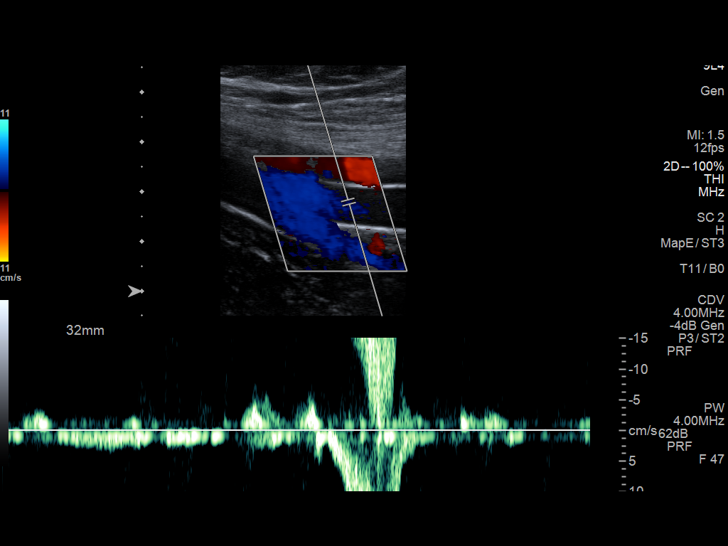
[im 33/64]
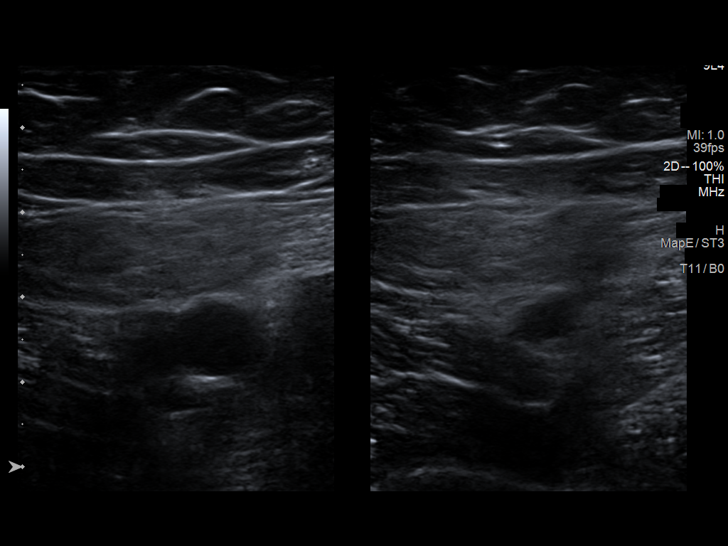
[im 36/64]
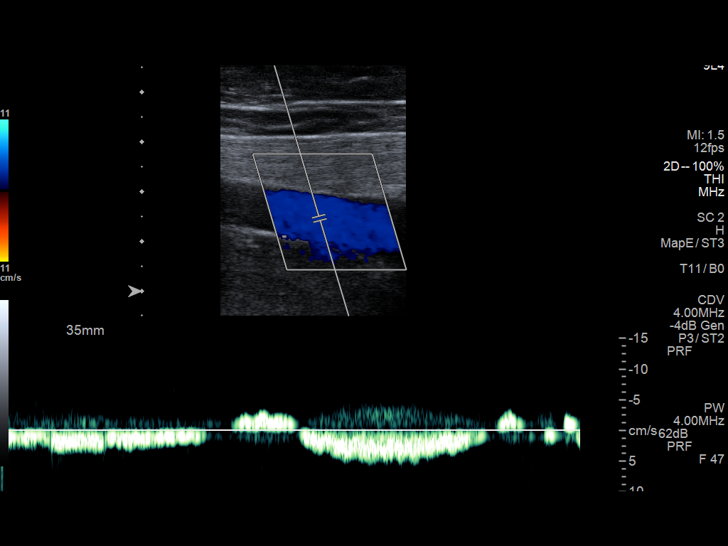
[im 42/64]
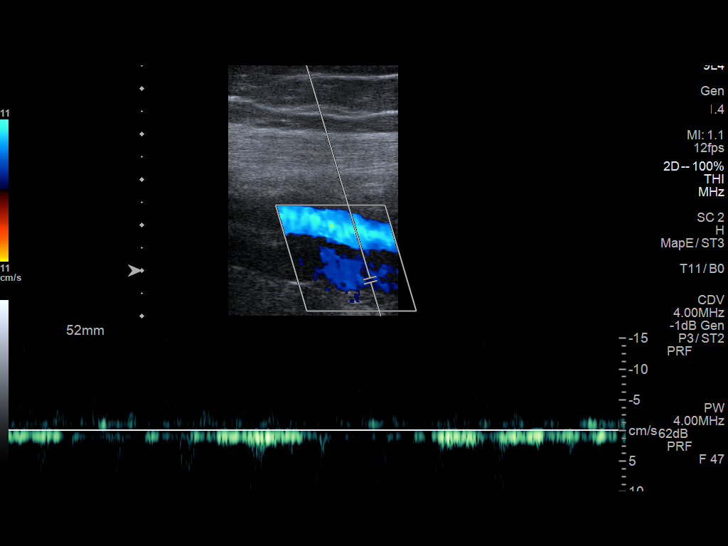
[im 47/64]
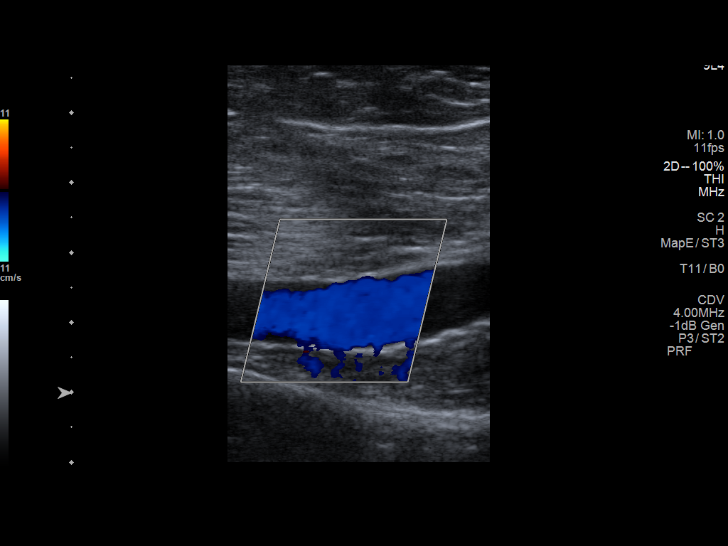
[im 53/64]
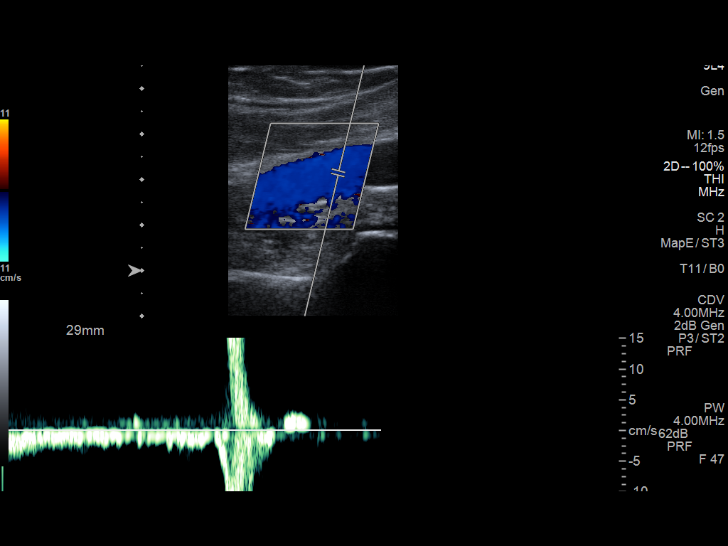
[im 58/64]
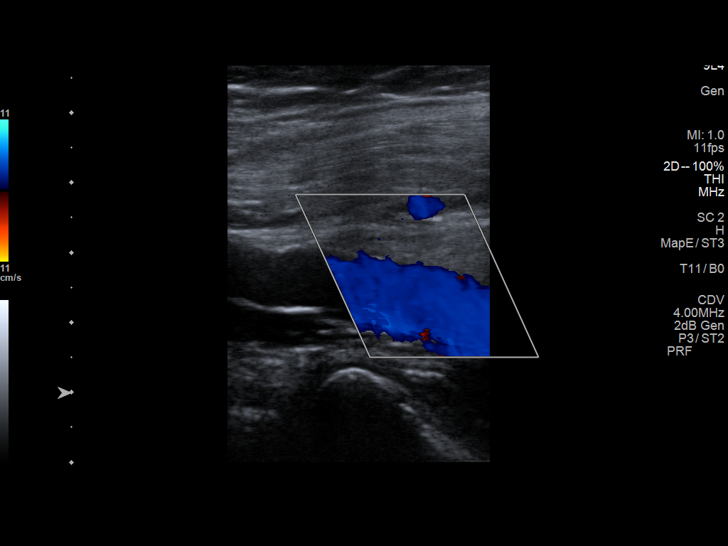
[im 64/64]
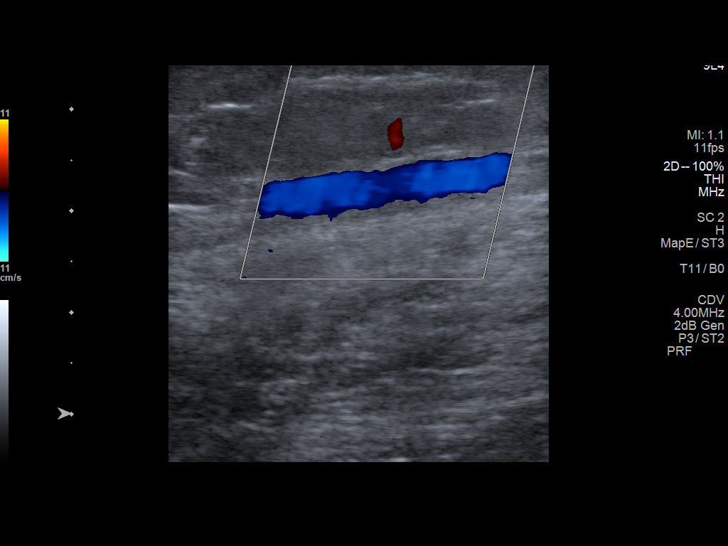

[13 of 24 positions shown; findings below may reference images not displayed]

FINDINGS: Contralateral Common Femoral Vein: Respiratory phasicity is normal
and symmetric with the symptomatic side. No evidence of thrombus.
Normal compressibility.

Common Femoral Vein: No evidence of thrombus. Normal
compressibility, respiratory phasicity and response to augmentation.

Saphenofemoral Junction: No evidence of thrombus. Normal
compressibility and flow on color Doppler imaging.

Profunda Femoral Vein: No evidence of thrombus. Normal
compressibility and flow on color Doppler imaging.

Femoral Vein: No evidence of thrombus. Normal compressibility,
respiratory phasicity and response to augmentation.

Popliteal Vein: No evidence of thrombus. Normal compressibility,
respiratory phasicity and response to augmentation.

Calf Veins: No evidence of thrombus. Normal compressibility and flow
on color Doppler imaging.

Superficial Great Saphenous Vein: No evidence of thrombus. Normal
compressibility and flow on color Doppler imaging.

Other Findings:  None.
IMPRESSION: Sonographic survey of the right lower extremity negative for DVT

## 2020-11-09 ENCOUNTER — Encounter: Payer: Self-pay | Admitting: Family Medicine

## 2020-11-09 ENCOUNTER — Ambulatory Visit: Payer: 59 | Admitting: Family Medicine

## 2020-11-09 ENCOUNTER — Other Ambulatory Visit: Payer: Self-pay

## 2020-11-09 VITALS — BP 112/70 | HR 80 | Temp 98.3°F | Ht 66.0 in | Wt 164.4 lb

## 2020-11-09 DIAGNOSIS — M1990 Unspecified osteoarthritis, unspecified site: Secondary | ICD-10-CM | POA: Diagnosis not present

## 2020-11-09 DIAGNOSIS — E079 Disorder of thyroid, unspecified: Secondary | ICD-10-CM | POA: Insufficient documentation

## 2020-11-09 DIAGNOSIS — E7800 Pure hypercholesterolemia, unspecified: Secondary | ICD-10-CM | POA: Diagnosis not present

## 2020-11-09 DIAGNOSIS — E785 Hyperlipidemia, unspecified: Secondary | ICD-10-CM | POA: Insufficient documentation

## 2020-11-09 DIAGNOSIS — E039 Hypothyroidism, unspecified: Secondary | ICD-10-CM | POA: Insufficient documentation

## 2020-11-09 DIAGNOSIS — E282 Polycystic ovarian syndrome: Secondary | ICD-10-CM | POA: Diagnosis not present

## 2020-11-09 MED ORDER — MELOXICAM 7.5 MG PO TABS: 7.5000 mg | ORAL_TABLET | Freq: Every day | ORAL | 0 refills | Status: DC | PRN

## 2020-11-09 NOTE — Addendum Note (Signed)
Addended by: Tommie Sams on: 11/09/2020 01:06 PM   Modules accepted: Level of Service

## 2020-11-09 NOTE — Progress Notes (Signed)
Subjective:  Patient ID: Elizabeth Moon, female    DOB: July 08, 1980  Age: 40 y.o. MRN: [id number]  CC:   HPI [name] is a 40 y.o. female presents to the clinic today to establish care.  Issues/concerns are below.  PCOS On metformin. Doing well at this time. No concerns about this.  Arthritis Frequent joint pains. Previously was recommended to see Rheumatology and did not proceed with the appt. Uses tylenol and ibuprofen as needed. Inquiring about other medications to use for stiffness and pain.  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  Most recent LDL 185. No medications at this time. Trying to watch diet.    PMH, Surgical Hx, Family Hx, Social History reviewed and updated as below.  Past Medical History:  Diagnosis Date   Abnormal Pap smear    Arthritis    Hypothyroidism 2010   PCOS (polycystic ovarian syndrome) Age 50s   Past Surgical History:  Procedure Laterality Date   APPENDECTOMY     TONSILLECTOMY     WISDOM TOOTH EXTRACTION     Family History  Problem Relation Age of Onset   Stroke Paternal Grandfather    Diabetes Paternal Grandfather    Depression Mother    Depression Maternal Aunt    Alzheimer's disease Maternal Grandmother    Stroke Maternal Grandmother    Depression Maternal Grandmother    Cancer Maternal Grandmother        skin   Diabetes Paternal Grandmother    Social History   Tobacco Use   Smoking status: Former   Smokeless tobacco: Never  Substance Use Topics   Alcohol use: No    Review of Systems  Constitutional:  Positive for fatigue.  Musculoskeletal:  Positive for arthralgias.   Objective:   Today's Vitals: BP 112/70   Pulse 80   Temp 98.3 F (36.8 C)   Ht 5\' 6"  (1.676 m)   Wt 164 lb 6.4 oz (74.6 kg)   SpO2 99%   BMI 26.53 kg/m   Physical Exam Vitals and nursing note reviewed.  Constitutional:      General: She is not in acute distress.    Appearance: Normal appearance. She is  not ill-appearing.  HENT:     Head: Normocephalic and atraumatic.     Right Ear: Tympanic membrane normal.     Left Ear: Tympanic membrane normal.     Mouth/Throat:     Pharynx: Oropharynx is clear.  Cardiovascular:     Rate and Rhythm: Normal rate and regular rhythm.     Heart sounds: No murmur heard. Pulmonary:     Effort: Pulmonary effort is normal.     Breath sounds: Normal breath sounds. No wheezing, rhonchi or rales.  Neurological:     Mental Status: She is alert.  Psychiatric:        Mood and Affect: Mood normal.        Behavior: Behavior normal.     Assessment & Plan:   Problem List Items Addressed This Visit       Endocrine   PCOS (polycystic ovarian syndrome) - Primary    Stable. No concerns at this time.  Last A1C 5.2.      Thyroid condition     Musculoskeletal and Integument   Arthritis    Mobic as needed.       Relevant Medications   meloxicam (MOBIC) 7.5 MG tablet     Other   Hyperlipidemia    Uncontrolled. Likely familial. Discussed diet/exercise. Discussed  pharmacotherapy. She would like to hold off on pharmacotherapy at this time. Will monitor closely.        Meds ordered this encounter  Medications   meloxicam (MOBIC) 7.5 MG tablet    Sig: Take 1-2 tablets (7.5-15 mg total) by mouth daily as needed for pain.    Dispense:  30 tablet    Refill:  0     Follow-up: Return in about 6 months (around 05/10/2021).  Everlene Other DO [facility]

## 2020-11-09 NOTE — Assessment & Plan Note (Signed)
Mobic as needed.

## 2020-11-09 NOTE — Assessment & Plan Note (Signed)
Stable. No concerns at this time.  Last A1C 5.2.

## 2020-11-09 NOTE — Assessment & Plan Note (Signed)
Uncontrolled. Likely familial. Discussed diet/exercise. Discussed pharmacotherapy. She would like to hold off on pharmacotherapy at this time. Will monitor closely.

## 2020-11-09 NOTE — Patient Instructions (Signed)
Labs in 6 months.  Exercise as much as you can. Watch your diet.  Mobic as needed.  Take care  Dr. Adriana Simas

## 2020-11-24 ENCOUNTER — Other Ambulatory Visit: Payer: Self-pay | Admitting: Family Medicine

## 2020-12-13 ENCOUNTER — Encounter: Payer: Self-pay | Admitting: Family Medicine

## 2020-12-13 ENCOUNTER — Other Ambulatory Visit: Payer: Self-pay | Admitting: Family Medicine

## 2020-12-13 MED ORDER — METFORMIN HCL ER 500 MG PO TB24: ORAL_TABLET | ORAL | 3 refills | Status: DC

## 2020-12-20 ENCOUNTER — Encounter: Payer: Self-pay | Admitting: Family Medicine

## 2020-12-20 MED ORDER — METFORMIN HCL ER 500 MG PO TB24: ORAL_TABLET | ORAL | 1 refills | Status: DC

## 2021-05-03 DIAGNOSIS — Z1231 Encounter for screening mammogram for malignant neoplasm of breast: Secondary | ICD-10-CM | POA: Diagnosis not present

## 2021-05-03 DIAGNOSIS — Z130 Encounter for screening for diseases of the blood and blood-forming organs and certain disorders involving the immune mechanism: Secondary | ICD-10-CM | POA: Diagnosis not present

## 2021-05-03 DIAGNOSIS — Z3041 Encounter for surveillance of contraceptive pills: Secondary | ICD-10-CM | POA: Diagnosis not present

## 2021-05-03 DIAGNOSIS — Z01419 Encounter for gynecological examination (general) (routine) without abnormal findings: Secondary | ICD-10-CM | POA: Diagnosis not present

## 2021-05-03 DIAGNOSIS — Z1389 Encounter for screening for other disorder: Secondary | ICD-10-CM | POA: Diagnosis not present

## 2021-06-29 ENCOUNTER — Other Ambulatory Visit: Payer: Self-pay | Admitting: Family Medicine

## 2021-09-21 ENCOUNTER — Other Ambulatory Visit: Payer: Self-pay | Admitting: Family Medicine

## 2021-10-19 ENCOUNTER — Encounter: Payer: Self-pay | Admitting: Family Medicine

## 2021-10-19 ENCOUNTER — Ambulatory Visit (INDEPENDENT_AMBULATORY_CARE_PROVIDER_SITE_OTHER): Payer: PRIVATE HEALTH INSURANCE | Admitting: Family Medicine

## 2021-10-19 DIAGNOSIS — J988 Other specified respiratory disorders: Secondary | ICD-10-CM

## 2021-10-19 DIAGNOSIS — B9789 Other viral agents as the cause of diseases classified elsewhere: Secondary | ICD-10-CM | POA: Insufficient documentation

## 2021-10-19 MED ORDER — BENZONATATE 200 MG PO CAPS: 200.0000 mg | ORAL_CAPSULE | Freq: Two times a day (BID) | ORAL | 0 refills | Status: DC | PRN

## 2021-10-19 MED ORDER — PROMETHAZINE-DM 6.25-15 MG/5ML PO SYRP: 5.0000 mL | ORAL_SOLUTION | Freq: Every evening | ORAL | 0 refills | Status: DC | PRN

## 2021-10-19 NOTE — Progress Notes (Signed)
Subjective:  Patient ID: Elizabeth Moon, female    DOB: April 08, 1980  Age: 41 y.o. MRN: [id number]  CC: Chief Complaint  Patient presents with   Cough    Dry non productive cough, headaches, sore throat. Pt states she feels better today. Pt states she did have a couple of days she felt bad     HPI:  41 year old female presents for evaluation of cough.  Patient reports she has not been feeling well for over a week.  She states that several of her kids have respiratory symptoms as well.  She reports sore throat, runny nose, and nonproductive cough.  Cough is the symptom that is bothering her the most.  No relieving factors.  No fever.  Patient Active Problem List   Diagnosis Date Noted   Viral respiratory infection 10/19/2021   PCOS (polycystic ovarian syndrome) 11/09/2020   Hyperlipidemia 11/09/2020   Thyroid condition 11/09/2020   Arthritis 11/09/2020    Social Hx   Social History   Socioeconomic History   Marital status: Married    Spouse name: Not on file   Number of children: Not on file   Years of education: Not on file   Highest education level: Not on file  Occupational History   Not on file  Tobacco Use   Smoking status: Former   Smokeless tobacco: Never  Vaping Use   Vaping Use: Never used  Substance and Sexual Activity   Alcohol use: No   Drug use: No   Sexual activity: Yes  Other Topics Concern   Not on file  Social History Narrative   Married for 14 years.Lives with husband and 4 kids.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   Social Determinants of Health   Financial Resource Strain: Not on file  Food Insecurity: Not on file  Transportation Needs: Not on file  Physical Activity: Not on file  Stress: Not on file  Social Connections: Not on file    Review of Systems Per HPI  Objective:  BP 113/79   Pulse 78   Temp 97.9 F (36.6 C)   Wt 161 lb (73 kg)   SpO2 100%   BMI 25.99 kg/m      10/19/2021   11:05 AM 11/09/2020   10:31 AM  08/03/2020    3:54 PM  BP/Weight  Systolic BP [id number] [id number]  Diastolic BP 79 70 66  Wt. (Lbs) 161 164.4 167  BMI 25.99 kg/m2 26.53 kg/m2 26.95 kg/m2    Physical Exam Vitals and nursing note reviewed.  Constitutional:      General: She is not in acute distress.    Appearance: Normal appearance.  HENT:     Head: Normocephalic and atraumatic.     Mouth/Throat:     Pharynx: Oropharynx is clear.  Eyes:     General:        Right eye: No discharge.        Left eye: No discharge.     Conjunctiva/sclera: Conjunctivae normal.  Cardiovascular:     Rate and Rhythm: Normal rate and regular rhythm.  Pulmonary:     Effort: Pulmonary effort is normal.     Breath sounds: Normal breath sounds. No wheezing, rhonchi or rales.  Neurological:     Mental Status: She is alert.  Psychiatric:        Mood and Affect: Mood normal.        Behavior: Behavior normal.     Lab Results  Component Value Date   WBC 3.6 (  L) 02/24/2020   HGB 13.4 02/24/2020   HCT 38.8 02/24/2020   PLT 262 02/24/2020   GLUCOSE 78 05/23/2020   CHOL 267 (H) 08/03/2020   TRIG 46 08/03/2020   HDL 68 08/03/2020   LDLCALC 185 (H) 08/03/2020   ALT 11 05/23/2020   AST 13 05/23/2020   NA 139 05/23/2020   K 3.9 05/23/2020   CL 104 05/23/2020   CREATININE 0.61 05/23/2020   BUN 13 05/23/2020   CO2 27 05/23/2020   TSH 0.93 08/03/2020   HGBA1C 5.2 02/24/2020     Assessment & Plan:   Problem List Items Addressed This Visit       Respiratory   Viral respiratory infection    Unremarkable exam.  At this point I do not think that COVID testing is worthwhile given the duration of her illness.  Treating with supportive care and Tessalon Perles as well as Promethazine DM.       Meds ordered this encounter  Medications   benzonatate (TESSALON) 200 MG capsule    Sig: Take 1 capsule (200 mg total) by mouth 2 (two) times daily as needed for cough.    Dispense:  20 capsule    Refill:  0   promethazine-dextromethorphan  (PROMETHAZINE-DM) 6.25-15 MG/5ML syrup    Sig: Take 5 mLs by mouth at bedtime as needed for cough.    Dispense:  118 mL    Refill:  0    Follow-up:  PRN  Vina

## 2021-10-19 NOTE — Assessment & Plan Note (Signed)
Unremarkable exam.  At this point I do not think that COVID testing is worthwhile given the duration of her illness.  Treating with supportive care and Tessalon Perles as well as Promethazine DM.

## 2021-10-19 NOTE — Patient Instructions (Signed)
This is likely viral.  Medications as prescribed.  If your symptoms continue to persist please let me know and I will place you on antibiotic therapy given the duration of your illness.  Take care  Dr. Lacinda Axon

## 2021-12-18 ENCOUNTER — Other Ambulatory Visit: Payer: Self-pay | Admitting: Family Medicine

## 2021-12-18 MED ORDER — AMOXICILLIN-POT CLAVULANATE 875-125 MG PO TABS: 1.0000 | ORAL_TABLET | Freq: Two times a day (BID) | ORAL | 0 refills | Status: DC

## 2022-02-10 ENCOUNTER — Other Ambulatory Visit: Payer: Self-pay | Admitting: Family Medicine

## 2022-04-19 ENCOUNTER — Encounter: Payer: Self-pay | Admitting: Family Medicine

## 2022-04-20 ENCOUNTER — Other Ambulatory Visit: Payer: Self-pay | Admitting: Family Medicine

## 2022-04-20 DIAGNOSIS — R41840 Attention and concentration deficit: Secondary | ICD-10-CM

## 2022-05-11 ENCOUNTER — Other Ambulatory Visit: Payer: Self-pay | Admitting: Family Medicine

## 2022-05-11 MED ORDER — AMPHETAMINE-DEXTROAMPHET ER 10 MG PO CP24: 10.0000 mg | ORAL_CAPSULE | Freq: Every day | ORAL | 0 refills | Status: DC

## 2022-06-20 ENCOUNTER — Encounter: Payer: Self-pay | Admitting: Family Medicine

## 2022-06-20 ENCOUNTER — Ambulatory Visit (INDEPENDENT_AMBULATORY_CARE_PROVIDER_SITE_OTHER): Payer: PRIVATE HEALTH INSURANCE | Admitting: Family Medicine

## 2022-06-20 VITALS — BP 100/62 | HR 80 | Temp 97.5°F | Ht 66.0 in | Wt 163.0 lb

## 2022-06-20 DIAGNOSIS — E7800 Pure hypercholesterolemia, unspecified: Secondary | ICD-10-CM | POA: Diagnosis not present

## 2022-06-20 DIAGNOSIS — E079 Disorder of thyroid, unspecified: Secondary | ICD-10-CM

## 2022-06-20 DIAGNOSIS — F988 Other specified behavioral and emotional disorders with onset usually occurring in childhood and adolescence: Secondary | ICD-10-CM | POA: Diagnosis not present

## 2022-06-20 DIAGNOSIS — E282 Polycystic ovarian syndrome: Secondary | ICD-10-CM

## 2022-06-20 DIAGNOSIS — Z130 Encounter for screening for diseases of the blood and blood-forming organs and certain disorders involving the immune mechanism: Secondary | ICD-10-CM

## 2022-06-20 MED ORDER — MELOXICAM 7.5 MG PO TABS: 7.5000 mg | ORAL_TABLET | Freq: Every day | ORAL | 0 refills | Status: DC | PRN

## 2022-06-20 MED ORDER — AMPHETAMINE-DEXTROAMPHET ER 15 MG PO CP24: 15.0000 mg | ORAL_CAPSULE | ORAL | 0 refills | Status: DC

## 2022-06-20 MED ORDER — METFORMIN HCL 500 MG PO TABS: ORAL_TABLET | ORAL | 3 refills | Status: DC

## 2022-06-20 NOTE — Assessment & Plan Note (Signed)
Continue metformin.

## 2022-06-20 NOTE — Progress Notes (Signed)
Subjective:  Patient ID: Elizabeth Moon, female    DOB: 1980-08-24  Age: 42 y.o. MRN: [id number]  CC: Chief Complaint  Patient presents with   Medication Refill   Labs Only    HPI:  42 year old female presents for follow-up.  Recent diagnosis of ADD.  I recently started Adderall after discussion with the patient.  He states that overall she is improving.  She is on Adderall XR 10 mg daily.  No side effects.  She feels like she would benefit from a slightly increased dose.  Patient has underlying hyperlipidemia.  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  She does not want statin therapy.  Will discuss this today.  Patient is worried about her future risk.  Needs labs.  Patient has PCOS.  She is in need of refill on metformin.  Patient also uses meloxicam as needed for arthritic pain.  Needs refill.  Patient Active Problem List   Diagnosis Date Noted   ADD (attention deficit disorder) 06/20/2022   PCOS (polycystic ovarian syndrome) 11/09/2020   Hyperlipidemia 11/09/2020   Thyroid condition 11/09/2020   Arthritis 11/09/2020    Social Hx   Social History   Socioeconomic History   Marital status: Married    Spouse name: Not on file   Number of children: Not on file   Years of education: Not on file   Highest education level: Not on file  Occupational History   Not on file  Tobacco Use   Smoking status: Former   Smokeless tobacco: Never  Vaping Use   Vaping Use: Never used  Substance and Sexual Activity   Alcohol use: No   Drug use: No   Sexual activity: Yes  Other Topics Concern   Not on file  Social History Narrative   Married for 14 years.Lives with husband and 4 kids.Social Designer, industrial/product for [employer].   Social Determinants of Health   Financial Resource Strain: Not on file  Food Insecurity: Not on file  Transportation Needs: Not on file  Physical Activity: Not on file  Stress: Not on file  Social Connections:  Not on file    Review of Systems Per HPI  Objective:  BP 100/62   Pulse 80   Temp (!) 97.5 F (36.4 C)   Ht 5\' 6"  (1.676 m)   Wt 163 lb (73.9 kg)   SpO2 99%   BMI 26.31 kg/m      06/20/2022    8:45 AM 10/19/2021   11:05 AM 11/09/2020   10:31 AM  BP/Weight  Systolic BP 100 113 112  Diastolic BP 62 79 70  Wt. (Lbs) 163 161 164.4  BMI 26.31 kg/m2 25.99 kg/m2 26.53 kg/m2    Physical Exam Vitals and nursing note reviewed.  Constitutional:      General: She is not in acute distress.    Appearance: Normal appearance.  HENT:     Head: Normocephalic and atraumatic.  Cardiovascular:     Rate and Rhythm: Normal rate and regular rhythm.  Pulmonary:     Effort: Pulmonary effort is normal.     Breath sounds: Normal breath sounds.  Neurological:     Mental Status: She is alert.  Psychiatric:        Mood and Affect: Mood normal.        Behavior: Behavior normal.     Lab Results  Component Value Date   WBC 3.6 (L) 02/24/2020   HGB 13.4 02/24/2020  HCT 38.8 02/24/2020   PLT 262 02/24/2020   GLUCOSE 78 05/23/2020   CHOL 267 (H) 08/03/2020   TRIG 46 08/03/2020   HDL 68 08/03/2020   LDLCALC 185 (H) 08/03/2020   ALT 11 05/23/2020   AST 13 05/23/2020   NA 139 05/23/2020   K 3.9 05/23/2020   CL 104 05/23/2020   CREATININE 0.61 05/23/2020   BUN 13 05/23/2020   CO2 27 05/23/2020   TSH 0.93 08/03/2020   HGBA1C 5.2 02/24/2020     Assessment & Plan:   Problem List Items Addressed This Visit       Endocrine   Thyroid condition   Relevant Orders   TSH   PCOS (polycystic ovarian syndrome)    Continue metformin.      Relevant Orders   CMP14+EGFR   Hemoglobin A1c     Other   ADD (attention deficit disorder) - Primary    Improved with treatment.  Increasing Adderall to 15 mg daily.  Follow-up in 3 months.      Hyperlipidemia    Discussed PCSK9.  Also discussed seeing lipid specialist.  Lipoprotein a and lipid panel for evaluation.      Relevant Orders    Lipid panel   Lipoprotein A (LPA)   Other Visit Diagnoses     Screening for deficiency anemia       Relevant Orders   CBC       Meds ordered this encounter  Medications   meloxicam (MOBIC) 7.5 MG tablet    Sig: Take 1-2 tablets (7.5-15 mg total) by mouth daily as needed for pain.    Dispense:  30 tablet    Refill:  0   metFORMIN (GLUCOPHAGE) 500 MG tablet    Sig: TAKE 1 TABLET BY MOUTH EVERY MORNING, AND 1 TABLET EVERY NIGHT AT BEDTIME.    Dispense:  180 tablet    Refill:  3   amphetamine-dextroamphetamine (ADDERALL XR) 15 MG 24 hr capsule    Sig: Take 1 capsule by mouth every morning.    Dispense:  30 capsule    Refill:  0   amphetamine-dextroamphetamine (ADDERALL XR) 15 MG 24 hr capsule    Sig: Take 1 capsule by mouth every morning.    Dispense:  30 capsule    Refill:  0   amphetamine-dextroamphetamine (ADDERALL XR) 15 MG 24 hr capsule    Sig: Take 1 capsule by mouth every morning.    Dispense:  30 capsule    Refill:  0    Follow-up:  3 months  Loys Hoselton Adriana Simas DO [facility]

## 2022-06-20 NOTE — Assessment & Plan Note (Signed)
Improved with treatment.  Increasing Adderall to 15 mg daily.  Follow-up in 3 months.

## 2022-06-20 NOTE — Patient Instructions (Addendum)
Medications as prescribed.  Labs ordered.  Follow up in 3 months.  Take care  Dr. Adriana Simas

## 2022-06-20 NOTE — Assessment & Plan Note (Signed)
Discussed PCSK9.  Also discussed seeing lipid specialist.  Lipoprotein a and lipid panel for evaluation.

## 2022-07-04 ENCOUNTER — Other Ambulatory Visit: Payer: Self-pay | Admitting: Family Medicine

## 2022-09-20 ENCOUNTER — Ambulatory Visit (INDEPENDENT_AMBULATORY_CARE_PROVIDER_SITE_OTHER): Payer: PRIVATE HEALTH INSURANCE | Admitting: Family Medicine

## 2022-09-20 VITALS — BP 103/66 | HR 68 | Temp 98.4°F | Ht 66.0 in | Wt 156.2 lb

## 2022-09-20 DIAGNOSIS — F988 Other specified behavioral and emotional disorders with onset usually occurring in childhood and adolescence: Secondary | ICD-10-CM | POA: Diagnosis not present

## 2022-09-20 DIAGNOSIS — E7800 Pure hypercholesterolemia, unspecified: Secondary | ICD-10-CM | POA: Diagnosis not present

## 2022-09-20 MED ORDER — AMPHETAMINE-DEXTROAMPHET ER 15 MG PO CP24: 15.0000 mg | ORAL_CAPSULE | ORAL | 0 refills | Status: DC

## 2022-09-20 NOTE — Patient Instructions (Signed)
Medications refilled.  Follow up in 3-6 months.

## 2022-09-20 NOTE — Progress Notes (Signed)
Subjective:  Patient ID: Elizabeth Moon, female    DOB: 02/27/1980  Age: 42 y.o. MRN: [id number]  CC: Follow up   HPI:  42 year old female presents for follow-up regarding ADD.  Doing well on Adderall.  Needs refills.  Patient has not yet gotten her blood work done.  Has severe hyperlipidemia.  Likely familial.  Patient Active Problem List   Diagnosis Date Noted   ADD (attention deficit disorder) 06/20/2022   PCOS (polycystic ovarian syndrome) 11/09/2020   Hyperlipidemia 11/09/2020   Arthritis 11/09/2020    Social Hx   Social History   Socioeconomic History   Marital status: Married    Spouse name: Not on file   Number of children: Not on file   Years of education: Not on file   Highest education level: Not on file  Occupational History   Not on file  Tobacco Use   Smoking status: Former   Smokeless tobacco: Never  Vaping Use   Vaping status: Never Used  Substance and Sexual Activity   Alcohol use: No   Drug use: No   Sexual activity: Yes  Other Topics Concern   Not on file  Social History Narrative   Married for 14 years.Lives with husband and 4 kids.Social Designer, industrial/product for [employer].   Social Determinants of Health   Financial Resource Strain: Not on file  Food Insecurity: Not on file  Transportation Needs: Not on file  Physical Activity: Not on file  Stress: Not on file  Social Connections: Not on file    Review of Systems  Constitutional: Negative.     Objective:  BP 103/66   Pulse 68   Temp 98.4 F (36.9 C)   Ht 5\' 6"  (1.676 m)   Wt 156 lb 3.2 oz (70.9 kg)   SpO2 100%   BMI 25.21 kg/m      09/20/2022    8:44 AM 06/20/2022    8:45 AM 10/19/2021   11:05 AM  BP/Weight  Systolic BP 103 100 113  Diastolic BP 66 62 79  Wt. (Lbs) 156.2 163 161  BMI 25.21 kg/m2 26.31 kg/m2 25.99 kg/m2    Physical Exam Vitals and nursing note reviewed.  Constitutional:      General: She is not in acute distress.    Appearance: Normal  appearance.  HENT:     Head: Normocephalic and atraumatic.  Eyes:     General:        Right eye: No discharge.        Left eye: No discharge.     Conjunctiva/sclera: Conjunctivae normal.  Cardiovascular:     Rate and Rhythm: Normal rate and regular rhythm.  Pulmonary:     Effort: Pulmonary effort is normal.     Breath sounds: Normal breath sounds. No wheezing, rhonchi or rales.  Neurological:     Mental Status: She is alert.  Psychiatric:        Mood and Affect: Mood normal.        Behavior: Behavior normal.     Lab Results  Component Value Date   WBC 3.6 (L) 02/24/2020   HGB 13.4 02/24/2020   HCT 38.8 02/24/2020   PLT 262 02/24/2020   GLUCOSE 78 05/23/2020   CHOL 267 (H) 08/03/2020   TRIG 46 08/03/2020   HDL 68 08/03/2020   LDLCALC 185 (H) 08/03/2020   ALT 11 05/23/2020   AST 13 05/23/2020   NA 139 05/23/2020   K 3.9 05/23/2020   CL 104 05/23/2020  CREATININE 0.61 05/23/2020   BUN 13 05/23/2020   CO2 27 05/23/2020   TSH 0.93 08/03/2020   HGBA1C 5.2 02/24/2020     Assessment & Plan:   Problem List Items Addressed This Visit       Other   ADD (attention deficit disorder) - Primary    Stable.  Continue Adderall.  Refilled today.      Hyperlipidemia    Advised to get labs.  Once labs return, we will revisit use of high intensity statin vs PCSK9.       Meds ordered this encounter  Medications   amphetamine-dextroamphetamine (ADDERALL XR) 15 MG 24 hr capsule    Sig: Take 1 capsule by mouth every morning.    Dispense:  30 capsule    Refill:  0   amphetamine-dextroamphetamine (ADDERALL XR) 15 MG 24 hr capsule    Sig: Take 1 capsule by mouth every morning.    Dispense:  30 capsule    Refill:  0   amphetamine-dextroamphetamine (ADDERALL XR) 15 MG 24 hr capsule    Sig: Take 1 capsule by mouth every morning.    Dispense:  30 capsule    Refill:  0    Follow-up:  3-6 months  Julian Askin Adriana Simas DO [facility]

## 2022-09-20 NOTE — Assessment & Plan Note (Signed)
Stable. Continue Adderall. Refilled today. 

## 2022-09-20 NOTE — Assessment & Plan Note (Signed)
Advised to get labs.  Once labs return, we will revisit use of high intensity statin vs PCSK9.

## 2022-11-14 ENCOUNTER — Encounter: Payer: Self-pay | Admitting: Family Medicine

## 2022-11-15 ENCOUNTER — Other Ambulatory Visit: Payer: Self-pay | Admitting: Family Medicine

## 2022-11-15 MED ORDER — AMPHETAMINE-DEXTROAMPHET ER 5 MG PO CP24: 15.0000 mg | ORAL_CAPSULE | Freq: Every day | ORAL | 0 refills | Status: DC

## 2023-03-06 ENCOUNTER — Encounter: Payer: Self-pay | Admitting: Family Medicine

## 2023-03-06 ENCOUNTER — Ambulatory Visit (INDEPENDENT_AMBULATORY_CARE_PROVIDER_SITE_OTHER): Payer: BC Managed Care – PPO | Admitting: Family Medicine

## 2023-03-06 DIAGNOSIS — E282 Polycystic ovarian syndrome: Secondary | ICD-10-CM | POA: Diagnosis not present

## 2023-03-06 DIAGNOSIS — R5383 Other fatigue: Secondary | ICD-10-CM | POA: Insufficient documentation

## 2023-03-06 DIAGNOSIS — F988 Other specified behavioral and emotional disorders with onset usually occurring in childhood and adolescence: Secondary | ICD-10-CM | POA: Diagnosis not present

## 2023-03-06 DIAGNOSIS — E7800 Pure hypercholesterolemia, unspecified: Secondary | ICD-10-CM | POA: Diagnosis not present

## 2023-03-06 MED ORDER — METFORMIN HCL 500 MG PO TABS: ORAL_TABLET | ORAL | 3 refills | Status: DC

## 2023-03-06 MED ORDER — AMPHETAMINE-DEXTROAMPHET ER 15 MG PO CP24: 15.0000 mg | ORAL_CAPSULE | ORAL | 0 refills | Status: DC

## 2023-03-06 MED ORDER — MELOXICAM 7.5 MG PO TABS: 7.5000 mg | ORAL_TABLET | Freq: Every day | ORAL | 3 refills | Status: AC | PRN

## 2023-03-06 NOTE — Assessment & Plan Note (Signed)
Stable. Adderall refilled.

## 2023-03-06 NOTE — Assessment & Plan Note (Signed)
Stable. Metformin refilled.

## 2023-03-06 NOTE — Progress Notes (Signed)
Subjective:  Patient ID: Elizabeth Moon, female    DOB: 09-14-1980  Age: 43 y.o. MRN: [id number]  CC:   Chief Complaint  Patient presents with   ADHD    Medication refills    HPI:  43 year old female with severe hyperlipidemia (consistent with familial hyperlipidemia), PCOS, ADD presents for follow up.  ADD controlled on Adderall. Patient is hoping to wean off this medication.  Reports significant fatigue. Recently saw NP at an integrative practice who started her on NP Thyroid (despite normal TSH and normal T4). I advised against this.  Numerous labs were obtained by this provider. Labs significant for LDL of >200.   Lipids uncontrolled and LDL very high. Will discuss treatment today.  Patient Active Problem List   Diagnosis Date Noted   Fatigue 03/06/2023   ADD (attention deficit disorder) 06/20/2022   PCOS (polycystic ovarian syndrome) 11/09/2020   Hyperlipidemia 11/09/2020   Arthritis 11/09/2020    Social Hx   Social History   Socioeconomic History   Marital status: Married    Spouse name: Not on file   Number of children: Not on file   Years of education: Not on file   Highest education level: Not on file  Occupational History   Not on file  Tobacco Use   Smoking status: Former   Smokeless tobacco: Never  Vaping Use   Vaping status: Never Used  Substance and Sexual Activity   Alcohol use: No   Drug use: No   Sexual activity: Yes  Other Topics Concern   Not on file  Social History Narrative   Married for 14 years.Lives with husband and 4 kids.Social Designer, industrial/product for [employer].   Social Drivers of Corporate investment banker Strain: Not on file  Food Insecurity: Not on file  Transportation Needs: Not on file  Physical Activity: Not on file  Stress: Not on file  Social Connections: Not on file    Review of Systems Per HPI  Objective:  BP 106/71   Pulse (!) 105   Temp (!) 97.1 F (36.2 C)   Wt 159 lb (72.1 kg)   SpO2 97%   BMI 25.66  kg/m      03/06/2023    1:12 PM 09/20/2022    8:44 AM 06/20/2022    8:45 AM  BP/Weight  Systolic BP 106 103 100  Diastolic BP 71 66 62  Wt. (Lbs) 159 156.2 163  BMI 25.66 kg/m2 25.21 kg/m2 26.31 kg/m2    Physical Exam Vitals and nursing note reviewed.  Constitutional:      General: She is not in acute distress.    Appearance: Normal appearance.  HENT:     Head: Normocephalic and atraumatic.  Eyes:     General:        Right eye: No discharge.        Left eye: No discharge.     Conjunctiva/sclera: Conjunctivae normal.  Cardiovascular:     Rate and Rhythm: Normal rate and regular rhythm.  Pulmonary:     Effort: Pulmonary effort is normal.     Breath sounds: Normal breath sounds. No wheezing, rhonchi or rales.  Neurological:     Mental Status: She is alert.  Psychiatric:        Mood and Affect: Mood normal.        Behavior: Behavior normal.     Lab Results  Component Value Date   WBC 3.6 (L) 02/24/2020   HGB 13.4 02/24/2020  HCT 38.8 02/24/2020   PLT 262 02/24/2020   GLUCOSE 78 05/23/2020   CHOL 267 (H) 08/03/2020   TRIG 46 08/03/2020   HDL 68 08/03/2020   LDLCALC 185 (H) 08/03/2020   ALT 11 05/23/2020   AST 13 05/23/2020   NA 139 05/23/2020   K 3.9 05/23/2020   CL 104 05/23/2020   CREATININE 0.61 05/23/2020   BUN 13 05/23/2020   CO2 27 05/23/2020   TSH 0.93 08/03/2020   HGBA1C 5.2 02/24/2020     Assessment & Plan:   Problem List Items Addressed This Visit       Endocrine   PCOS (polycystic ovarian syndrome)   Stable. Metformin refilled.        Other   ADD (attention deficit disorder)   Stable. Adderall refilled.      Fatigue   Multifactorial. I strongly recommend against use of NP thyroid. This is not appropriate as she is euthyroid.      Hyperlipidemia   Uncontrolled and untreated. Recommended treatment given LDL > 190.  Patient refuses statin therapy. Recommend PCSK9. Patient states she will consider.  Recommend plant based diet and  regular exercise.       Meds ordered this encounter  Medications   amphetamine-dextroamphetamine (ADDERALL XR) 15 MG 24 hr capsule    Sig: Take 1 capsule by mouth every morning.    Dispense:  30 capsule    Refill:  0   amphetamine-dextroamphetamine (ADDERALL XR) 15 MG 24 hr capsule    Sig: Take 1 capsule by mouth every morning.    Dispense:  30 capsule    Refill:  0   amphetamine-dextroamphetamine (ADDERALL XR) 15 MG 24 hr capsule    Sig: Take 1 capsule by mouth every morning.    Dispense:  30 capsule    Refill:  0   meloxicam (MOBIC) 7.5 MG tablet    Sig: Take 1-2 tablets (7.5-15 mg total) by mouth daily as needed for pain.    Dispense:  30 tablet    Refill:  3   metFORMIN (GLUCOPHAGE) 500 MG tablet    Sig: TAKE 1 TABLET BY MOUTH EVERY MORNING, AND 1 TABLET EVERY NIGHT AT BEDTIME.    Dispense:  180 tablet    Refill:  3    Follow-up:  Return in about 3 months (around 06/03/2023).  Everlene Other DO [facility]

## 2023-03-06 NOTE — Assessment & Plan Note (Addendum)
Uncontrolled and untreated. Recommended treatment given LDL > 190.  Patient refuses statin therapy. Recommend PCSK9. Patient states she will consider.  Recommend plant based diet and regular exercise.

## 2023-03-06 NOTE — Patient Instructions (Signed)
-  Medications refilled  - Follow-up in 3 months

## 2023-03-06 NOTE — Assessment & Plan Note (Signed)
Multifactorial. I strongly recommend against use of NP thyroid. This is not appropriate as she is euthyroid.

## 2023-03-12 DIAGNOSIS — F419 Anxiety disorder, unspecified: Secondary | ICD-10-CM | POA: Diagnosis not present

## 2023-03-12 DIAGNOSIS — R5383 Other fatigue: Secondary | ICD-10-CM | POA: Diagnosis not present

## 2023-03-12 DIAGNOSIS — E282 Polycystic ovarian syndrome: Secondary | ICD-10-CM | POA: Diagnosis not present

## 2023-03-12 DIAGNOSIS — M2550 Pain in unspecified joint: Secondary | ICD-10-CM | POA: Diagnosis not present

## 2023-03-12 DIAGNOSIS — E785 Hyperlipidemia, unspecified: Secondary | ICD-10-CM | POA: Diagnosis not present

## 2023-03-12 DIAGNOSIS — E063 Autoimmune thyroiditis: Secondary | ICD-10-CM | POA: Diagnosis not present

## 2023-03-12 DIAGNOSIS — E611 Iron deficiency: Secondary | ICD-10-CM | POA: Diagnosis not present

## 2023-03-12 DIAGNOSIS — F909 Attention-deficit hyperactivity disorder, unspecified type: Secondary | ICD-10-CM | POA: Diagnosis not present

## 2023-03-27 DIAGNOSIS — E063 Autoimmune thyroiditis: Secondary | ICD-10-CM | POA: Diagnosis not present

## 2023-03-27 DIAGNOSIS — F909 Attention-deficit hyperactivity disorder, unspecified type: Secondary | ICD-10-CM | POA: Diagnosis not present

## 2023-03-27 DIAGNOSIS — N943 Premenstrual tension syndrome: Secondary | ICD-10-CM | POA: Diagnosis not present

## 2023-03-27 DIAGNOSIS — E282 Polycystic ovarian syndrome: Secondary | ICD-10-CM | POA: Diagnosis not present

## 2023-06-04 ENCOUNTER — Telehealth: Payer: BC Managed Care – PPO | Admitting: Family Medicine

## 2023-06-04 DIAGNOSIS — E7800 Pure hypercholesterolemia, unspecified: Secondary | ICD-10-CM | POA: Diagnosis not present

## 2023-06-04 DIAGNOSIS — F988 Other specified behavioral and emotional disorders with onset usually occurring in childhood and adolescence: Secondary | ICD-10-CM | POA: Diagnosis not present

## 2023-06-04 DIAGNOSIS — E282 Polycystic ovarian syndrome: Secondary | ICD-10-CM | POA: Diagnosis not present

## 2023-06-04 MED ORDER — AMPHETAMINE-DEXTROAMPHET ER 15 MG PO CP24: 15.0000 mg | ORAL_CAPSULE | ORAL | 0 refills | Status: AC

## 2023-06-04 MED ORDER — AMPHETAMINE-DEXTROAMPHET ER 15 MG PO CP24: 15.0000 mg | ORAL_CAPSULE | ORAL | 0 refills | Status: DC

## 2023-06-04 NOTE — Assessment & Plan Note (Signed)
 Stable on metformin. Continue

## 2023-06-04 NOTE — Assessment & Plan Note (Signed)
 We had a lengthy discussion today about her underlying risk given that she has had LDLs greater than 190.  We discussed Repatha.  Patient will consider.

## 2023-06-04 NOTE — Progress Notes (Signed)
 Virtual Visit via Video Note  I connected with Elizabeth Moon on 06/04/23 at  9:00 AM EDT by a video enabled telemedicine application and verified that I am speaking with the correct person using two identifiers.  Location: Patient: Home Provider: Home (Office not currently operational as we do not have water).   I discussed the limitations of evaluation and management by telemedicine and the availability of in person appointments. The patient expressed understanding and agreed to proceed.  History of Present Illness:  43 year old female with HLD (likely familial), ADD, PCOS presents for follow up.  ADD stable. She is trying to decrease use.  Requesting refill.  PCOS stable on Metformin .  Patient still not sure about treatment for hyperlipidemia.  Will discuss today.  Observations/Objective: General: Well-appearing female in no acute distress. Respiratory: Speaking in full sentences.  No respiratory distress.  Assessment and Plan:  PCOS (polycystic ovarian syndrome) Assessment & Plan: Stable on metformin .  Continue.   Pure hypercholesterolemia Assessment & Plan: We had a lengthy discussion today about her underlying risk given that she has had LDLs greater than 190.  We discussed Repatha.  Patient will consider.   Attention deficit disorder, unspecified type Assessment & Plan: Stable.  Medications refilled.  Orders: -     Amphetamine -Dextroamphet ER; Take 1 capsule by mouth every morning.  Dispense: 30 capsule; Refill: 0 -     Amphetamine -Dextroamphet ER; Take 1 capsule by mouth every morning.  Dispense: 30 capsule; Refill: 0 -     Amphetamine -Dextroamphet ER; Take 1 capsule by mouth every morning.  Dispense: 30 capsule; Refill: 0   Follow Up Instructions:    I discussed the assessment and treatment plan with the patient. The patient was provided an opportunity to ask questions and all were answered. The patient agreed with the plan and demonstrated an understanding  of the instructions.   The patient was advised to call back or seek an in-person evaluation if the symptoms worsen or if the condition fails to improve as anticipated.  I provided 12 minutes of non-face-to-face time during this encounter.   Adiah Guereca G Erol Flanagin, DO

## 2023-06-04 NOTE — Assessment & Plan Note (Signed)
Stable.  Medications refilled. 

## 2023-07-24 DIAGNOSIS — M9901 Segmental and somatic dysfunction of cervical region: Secondary | ICD-10-CM | POA: Diagnosis not present

## 2023-07-24 DIAGNOSIS — M9902 Segmental and somatic dysfunction of thoracic region: Secondary | ICD-10-CM | POA: Diagnosis not present

## 2023-07-24 DIAGNOSIS — M546 Pain in thoracic spine: Secondary | ICD-10-CM | POA: Diagnosis not present

## 2023-07-24 DIAGNOSIS — M542 Cervicalgia: Secondary | ICD-10-CM | POA: Diagnosis not present

## 2023-08-09 DIAGNOSIS — F3289 Other specified depressive episodes: Secondary | ICD-10-CM | POA: Diagnosis not present

## 2023-08-09 DIAGNOSIS — F418 Other specified anxiety disorders: Secondary | ICD-10-CM | POA: Diagnosis not present

## 2023-08-16 DIAGNOSIS — F3289 Other specified depressive episodes: Secondary | ICD-10-CM | POA: Diagnosis not present

## 2023-08-16 DIAGNOSIS — F418 Other specified anxiety disorders: Secondary | ICD-10-CM | POA: Diagnosis not present

## 2023-08-19 DIAGNOSIS — L509 Urticaria, unspecified: Secondary | ICD-10-CM | POA: Diagnosis not present

## 2023-08-29 ENCOUNTER — Encounter: Payer: Self-pay | Admitting: Family Medicine

## 2023-08-30 ENCOUNTER — Telehealth: Admitting: Family Medicine

## 2023-08-30 DIAGNOSIS — J019 Acute sinusitis, unspecified: Secondary | ICD-10-CM | POA: Diagnosis not present

## 2023-08-30 DIAGNOSIS — H1033 Unspecified acute conjunctivitis, bilateral: Secondary | ICD-10-CM

## 2023-08-30 DIAGNOSIS — B9689 Other specified bacterial agents as the cause of diseases classified elsewhere: Secondary | ICD-10-CM | POA: Diagnosis not present

## 2023-08-30 DIAGNOSIS — F3289 Other specified depressive episodes: Secondary | ICD-10-CM | POA: Diagnosis not present

## 2023-08-30 DIAGNOSIS — F418 Other specified anxiety disorders: Secondary | ICD-10-CM | POA: Diagnosis not present

## 2023-08-30 MED ORDER — AMOXICILLIN-POT CLAVULANATE 875-125 MG PO TABS
1.0000 | ORAL_TABLET | Freq: Two times a day (BID) | ORAL | 0 refills | Status: DC
Start: 2023-08-30 — End: 2023-12-16

## 2023-08-30 MED ORDER — NEOMYCIN-POLYMYXIN-HC 3.5-10000-1 OP SUSP: 3.0000 [drp] | Freq: Four times a day (QID) | OPHTHALMIC | 0 refills | Status: AC

## 2023-08-30 NOTE — Patient Instructions (Signed)
 Bacterial Conjunctivitis, Adult Bacterial conjunctivitis is an infection of the clear membrane that covers the white part of the eye and the inner surface of the eyelid (conjunctiva). When the blood vessels in the conjunctiva become inflamed, the eye becomes red or pink. The eye often feels irritated or itchy. Bacterial conjunctivitis spreads easily from person to person (is contagious). It also spreads easily from one eye to the other eye. What are the causes? This condition is caused by bacteria. You may get the infection if you come into close contact with: A person who is infected with the bacteria. Items that are contaminated with the bacteria, such as a face towel, contact lens solution, or eye makeup. What increases the risk? You are more likely to develop this condition if: You are exposed to other people who have the infection. You wear contact lenses. You have a sinus infection. You have had a recent eye injury or surgery. You have a weak body defense system (immune system). You have a medical condition that causes dry eyes. What are the signs or symptoms? Symptoms of this condition include: Thick, yellowish discharge from the eye. This may turn into a crust on the eyelid overnight and cause your eyelids to stick together. Tearing or watery eyes. Itchy eyes. Burning feeling in your eyes. Eye redness. Swollen eyelids. Blurred vision. How is this diagnosed? This condition is diagnosed based on your symptoms and medical history. Your health care provider may also take a sample of discharge from your eye to find the cause of your infection. How is this treated? This condition may be treated with: Antibiotic eye drops or ointment to clear the infection more quickly and prevent the spread of infection to others. Antibiotic medicines taken by mouth (orally) to treat infections that do not respond to drops or ointments or that last longer than 10 days. Cool, wet cloths (cool  compresses) placed on the eyes. Artificial tears applied 2-6 times a day. Follow these instructions at home: Medicines Take or apply your antibiotic medicine as told by your health care provider. Do not stop using the antibiotic, even if your condition improves, unless directed by your health care provider. Take or apply over-the-counter and prescription medicines only as told by your health care provider. Be very careful to avoid touching the edge of your eyelid with the eye-drop bottle or the ointment tube when you apply medicines to the affected eye. This will keep you from spreading the infection to your other eye or to other people. Managing discomfort Gently wipe away any drainage from your eye with a warm, wet washcloth or a cotton ball. Apply a clean, cool compress to your eye for 10-20 minutes, 3-4 times a day. General instructions Do not wear contact lenses until the inflammation is gone and your health care provider says it is safe to wear them again. Ask your health care provider how to sterilize or replace your contact lenses before you use them again. Wear glasses until you can resume wearing contact lenses. Avoid wearing eye makeup until the inflammation is gone. Throw away any old eye cosmetics that may be contaminated. Change or wash your pillowcase every day. Do not share towels or washcloths. This may spread the infection. Wash your hands often with soap and water for at least 20 seconds and especially before touching your face or eyes. Use paper towels to dry your hands. Avoid touching or rubbing your eyes. Do not drive or use heavy machinery if your vision is blurred. Contact  a health care provider if: You have a fever. Your symptoms do not get better after 10 days. Get help right away if: You have a fever and your symptoms suddenly get worse. You have severe pain when you move your eye. You have facial pain, redness, or swelling. You have a sudden loss of  vision. Summary Bacterial conjunctivitis is an infection of the clear membrane that covers the white part of the eye and the inner surface of the eyelid (conjunctiva). Bacterial conjunctivitis spreads easily from eye to eye and from person to person (is contagious). Wash your hands often with soap and water for at least 20 seconds and especially before touching your face or eyes. Use paper towels to dry your hands. Take or apply your antibiotic medicine as told by your health care provider. Do not stop using the antibiotic even if your condition improves. Contact a health care provider if you have a fever or if your symptoms do not get better after 10 days. Get help right away if you have a sudden loss of vision. This information is not intended to replace advice given to you by your health care provider. Make sure you discuss any questions you have with your health care provider. Document Revised: 04/20/2020 Document Reviewed: 04/20/2020 Elsevier Patient Education  2024 [employer]Sinus Infection, Adult A sinus infection, also called sinusitis, is inflammation of your sinuses. Sinuses are hollow spaces in the bones around your face. Your sinuses are located: Around your eyes. In the middle of your forehead. Behind your nose. In your cheekbones. Mucus normally drains out of your sinuses. When your nasal tissues become inflamed or swollen, mucus can become trapped or blocked. This allows bacteria, viruses, and fungi to grow, which leads to infection. Most infections of the sinuses are caused by a virus. A sinus infection can develop quickly. It can last for up to 4 weeks (acute) or for more than 12 weeks (chronic). A sinus infection often develops after a cold. What are the causes? This condition is caused by anything that creates swelling in the sinuses or stops mucus from draining. This includes: Allergies. Asthma. Infection from bacteria or viruses. Deformities or blockages in your nose or  sinuses. Abnormal growths in the nose (nasal polyps). Pollutants, such as chemicals or irritants in the air. Infection from fungi. This is rare. What increases the risk? You are more likely to develop this condition if you: Have a weak body defense system (immune system). Do a lot of swimming or diving. Overuse nasal sprays. Smoke. What are the signs or symptoms? The main symptoms of this condition are pain and a feeling of pressure around the affected sinuses. Other symptoms include: Stuffy nose or congestion that makes it difficult to breathe through your nose. Thick yellow or greenish drainage from your nose. Tenderness, swelling, and warmth over the affected sinuses. A cough that may get worse at night. Decreased sense of smell and taste. Extra mucus that collects in the throat or the back of the nose (postnasal drip) causing a sore throat or bad breath. Tiredness (fatigue). Fever. How is this diagnosed? This condition is diagnosed based on: Your symptoms. Your medical history. A physical exam. Tests to find out if your condition is acute or chronic. This may include: Checking your nose for nasal polyps. Viewing your sinuses using a device that has a light (endoscope). Testing for allergies or bacteria. Imaging tests, such as an MRI or CT scan. In rare cases, a bone biopsy may be done  to rule out more serious types of fungal sinus disease. How is this treated? Treatment for a sinus infection depends on the cause and whether your condition is chronic or acute. If caused by a virus, your symptoms should go away on their own within 10 days. You may be given medicines to relieve symptoms. They include: Medicines that shrink swollen nasal passages (decongestants). A spray that eases inflammation of the nostrils (topical intranasal corticosteroids). Rinses that help get rid of thick mucus in your nose (nasal saline washes). Medicines that treat allergies  (antihistamines). Over-the-counter pain relievers. If caused by bacteria, your health care provider may recommend waiting to see if your symptoms improve. Most bacterial infections will get better without antibiotic medicine. You may be given antibiotics if you have: A severe infection. A weak immune system. If caused by narrow nasal passages or nasal polyps, surgery may be needed. Follow these instructions at home: Medicines Take, use, or apply over-the-counter and prescription medicines only as told by your health care provider. These may include nasal sprays. If you were prescribed an antibiotic medicine, take it as told by your health care provider. Do not stop taking the antibiotic even if you start to feel better. Hydrate and humidify  Drink enough fluid to keep your urine pale yellow. Staying hydrated will help to thin your mucus. Use a cool mist humidifier to keep the humidity level in your home above 50%. Inhale steam for 10-15 minutes, 3-4 times a day, or as told by your health care provider. You can do this in the bathroom while a hot shower is running. Limit your exposure to cool or dry air. Rest Rest as much as possible. Sleep with your head raised (elevated). Make sure you get enough sleep each night. General instructions  Apply a warm, moist washcloth to your face 3-4 times a day or as told by your health care provider. This will help with discomfort. Use nasal saline washes as often as told by your health care provider. Wash your hands often with soap and water to reduce your exposure to germs. If soap and water are not available, use hand sanitizer. Do not smoke. Avoid being around people who are smoking (secondhand smoke). Keep all follow-up visits. This is important. Contact a health care provider if: You have a fever. Your symptoms get worse. Your symptoms do not improve within 10 days. Get help right away if: You have a severe headache. You have persistent  vomiting. You have severe pain or swelling around your face or eyes. You have vision problems. You develop confusion. Your neck is stiff. You have trouble breathing. These symptoms may be an emergency. Get help right away. Call 911. Do not wait to see if the symptoms will go away. Do not drive yourself to the hospital. Summary A sinus infection is soreness and inflammation of your sinuses. Sinuses are hollow spaces in the bones around your face. This condition is caused by nasal tissues that become inflamed or swollen. The swelling traps or blocks the flow of mucus. This allows bacteria, viruses, and fungi to grow, which leads to infection. If you were prescribed an antibiotic medicine, take it as told by your health care provider. Do not stop taking the antibiotic even if you start to feel better. Keep all follow-up visits. This is important. This information is not intended to replace advice given to you by your health care provider. Make sure you discuss any questions you have with your health care provider. Document Revised:  12/13/2020 Document Reviewed: 12/13/2020 Elsevier Patient Education  2024 [employer].

## 2023-08-30 NOTE — Progress Notes (Signed)
 Virtual Visit Consent   Elizabeth Moon, you are scheduled for a virtual visit with a [facility] provider today. Just as with appointments in the office, your consent must be obtained to participate. Your consent will be active for this visit and any virtual visit you may have with one of our providers in the next 365 days. If you have a MyChart account, a copy of this consent can be sent to you electronically.  As this is a virtual visit, video technology does not allow for your provider to perform a traditional examination. This may limit your provider's ability to fully assess your condition. If your provider identifies any concerns that need to be evaluated in person or the need to arrange testing (such as labs, EKG, etc.), we will make arrangements to do so. Although advances in technology are sophisticated, we cannot ensure that it will always work on either your end or our end. If the connection with a video visit is poor, the visit may have to be switched to a telephone visit. With either a video or telephone visit, we are not always able to ensure that we have a secure connection.  By engaging in this virtual visit, you consent to the provision of healthcare and authorize for your insurance to be billed (if applicable) for the services provided during this visit. Depending on your insurance coverage, you may receive a charge related to this service.  I need to obtain your verbal consent now. Are you willing to proceed with your visit today? HAYLEY HORN has provided verbal consent on 08/30/2023 for a virtual visit (video or telephone). Loa Lamp, FNP  Date: 08/30/2023 9:01 AM   Virtual Visit via Video Note   I, Loa Lamp, connected with  [name]  ([id number], 1980/05/28) on 08/30/23 at  9:00 AM EDT by a video-enabled telemedicine application and verified that I am speaking with the correct person using two identifiers.  Location: Patient: Virtual Visit Location Patient:  Home Provider: Virtual Visit Location Provider: Home Office   I discussed the limitations of evaluation and management by telemedicine and the availability of in person appointments. The patient expressed understanding and agreed to proceed.    History of Present Illness: Elizabeth Moon is a 43 y.o. who identifies as a female who was assigned female at birth, and is being seen today for sinuns pressure and pain, green post nasal drainage, no cough, no fever, headaches present, sore throat, sx for 3 weeks worsening. She now has redness and yellow drainage from both eyes. [name]  HPI: HPI  Problems:  Patient Active Problem List   Diagnosis Date Noted   ADD (attention deficit disorder) 06/20/2022   PCOS (polycystic ovarian syndrome) 11/09/2020   Hyperlipidemia 11/09/2020   Arthritis 11/09/2020    Allergies:  Allergies  Allergen Reactions   Latex Rash and Other (See Comments)   Medications:  Current Outpatient Medications:    amoxicillin -clavulanate (AUGMENTIN ) 875-125 MG tablet, Take 1 tablet by mouth 2 (two) times daily., Disp: 20 tablet, Rfl: 0   neomycin -polymyxin-hydrocortisone (CORTISPORIN) 3.5-10000-1 ophthalmic suspension, Place 3 drops into both eyes 4 (four) times daily for 7 days., Disp: 4.2 mL, Rfl: 0   amphetamine -dextroamphetamine (ADDERALL XR) 15 MG 24 hr capsule, Take 1 capsule by mouth every morning., Disp: 30 capsule, Rfl: 0   amphetamine -dextroamphetamine (ADDERALL XR) 15 MG 24 hr capsule, Take 1 capsule by mouth every morning., Disp: 30 capsule, Rfl: 0   amphetamine -dextroamphetamine (ADDERALL XR) 15 MG 24 hr  capsule, Take 1 capsule by mouth every morning., Disp: 30 capsule, Rfl: 0   meloxicam  (MOBIC ) 7.5 MG tablet, Take 1-2 tablets (7.5-15 mg total) by mouth daily as needed for pain., Disp: 30 tablet, Rfl: 3   metFORMIN  (GLUCOPHAGE ) 500 MG tablet, TAKE 1 TABLET BY MOUTH EVERY MORNING, AND 1 TABLET EVERY NIGHT AT BEDTIME., Disp: 180 tablet, Rfl:  3  Observations/Objective: Patient is well-developed, well-nourished in no acute distress.  Resting comfortably  at home.  Head is normocephalic, atraumatic.  No labored breathing.  Speech is clear and coherent with logical content.  Patient is alert and oriented at baseline.    Assessment and Plan: 1. Acute bacterial sinusitis (Primary)  2. Acute conjunctivitis of both eyes, unspecified acute conjunctivitis type  Increase fluids, humidifier at night, tylenol  or ibuprofen  as directed, flonase, UC if sx persist or worsen.   Follow Up Instructions: I discussed the assessment and treatment plan with the patient. The patient was provided an opportunity to ask questions and all were answered. The patient agreed with the plan and demonstrated an understanding of the instructions.  A copy of instructions were sent to the patient via MyChart unless otherwise noted below.     The patient was advised to call back or seek an in-person evaluation if the symptoms worsen or if the condition fails to improve as anticipated.    [name], FNP

## 2023-09-02 DIAGNOSIS — L815 Leukoderma, not elsewhere classified: Secondary | ICD-10-CM | POA: Diagnosis not present

## 2023-09-02 DIAGNOSIS — D2372 Other benign neoplasm of skin of left lower limb, including hip: Secondary | ICD-10-CM | POA: Diagnosis not present

## 2023-09-02 DIAGNOSIS — D2239 Melanocytic nevi of other parts of face: Secondary | ICD-10-CM | POA: Diagnosis not present

## 2023-09-02 DIAGNOSIS — D1801 Hemangioma of skin and subcutaneous tissue: Secondary | ICD-10-CM | POA: Diagnosis not present

## 2023-09-04 DIAGNOSIS — R5383 Other fatigue: Secondary | ICD-10-CM | POA: Diagnosis not present

## 2023-09-04 DIAGNOSIS — F419 Anxiety disorder, unspecified: Secondary | ICD-10-CM | POA: Diagnosis not present

## 2023-09-04 DIAGNOSIS — E282 Polycystic ovarian syndrome: Secondary | ICD-10-CM | POA: Diagnosis not present

## 2023-09-04 DIAGNOSIS — E785 Hyperlipidemia, unspecified: Secondary | ICD-10-CM | POA: Diagnosis not present

## 2023-09-04 DIAGNOSIS — E559 Vitamin D deficiency, unspecified: Secondary | ICD-10-CM | POA: Diagnosis not present

## 2023-09-04 DIAGNOSIS — F909 Attention-deficit hyperactivity disorder, unspecified type: Secondary | ICD-10-CM | POA: Diagnosis not present

## 2023-09-04 DIAGNOSIS — E063 Autoimmune thyroiditis: Secondary | ICD-10-CM | POA: Diagnosis not present

## 2023-09-11 DIAGNOSIS — F909 Attention-deficit hyperactivity disorder, unspecified type: Secondary | ICD-10-CM | POA: Diagnosis not present

## 2023-09-11 DIAGNOSIS — E282 Polycystic ovarian syndrome: Secondary | ICD-10-CM | POA: Diagnosis not present

## 2023-09-11 DIAGNOSIS — E063 Autoimmune thyroiditis: Secondary | ICD-10-CM | POA: Diagnosis not present

## 2023-09-11 DIAGNOSIS — N943 Premenstrual tension syndrome: Secondary | ICD-10-CM | POA: Diagnosis not present

## 2023-09-17 DIAGNOSIS — E282 Polycystic ovarian syndrome: Secondary | ICD-10-CM | POA: Diagnosis not present

## 2023-09-17 DIAGNOSIS — B2790 Infectious mononucleosis, unspecified without complication: Secondary | ICD-10-CM | POA: Diagnosis not present

## 2023-09-17 DIAGNOSIS — R5383 Other fatigue: Secondary | ICD-10-CM | POA: Diagnosis not present

## 2023-09-17 DIAGNOSIS — F909 Attention-deficit hyperactivity disorder, unspecified type: Secondary | ICD-10-CM | POA: Diagnosis not present

## 2023-09-17 DIAGNOSIS — D729 Disorder of white blood cells, unspecified: Secondary | ICD-10-CM | POA: Diagnosis not present

## 2023-09-17 DIAGNOSIS — J160 Chlamydial pneumonia: Secondary | ICD-10-CM | POA: Diagnosis not present

## 2023-09-17 DIAGNOSIS — E063 Autoimmune thyroiditis: Secondary | ICD-10-CM | POA: Diagnosis not present

## 2023-09-17 DIAGNOSIS — N943 Premenstrual tension syndrome: Secondary | ICD-10-CM | POA: Diagnosis not present

## 2023-09-17 DIAGNOSIS — B960 Mycoplasma pneumoniae [M. pneumoniae] as the cause of diseases classified elsewhere: Secondary | ICD-10-CM | POA: Diagnosis not present

## 2023-09-27 DIAGNOSIS — F3289 Other specified depressive episodes: Secondary | ICD-10-CM | POA: Diagnosis not present

## 2023-09-27 DIAGNOSIS — F418 Other specified anxiety disorders: Secondary | ICD-10-CM | POA: Diagnosis not present

## 2023-10-07 DIAGNOSIS — F3289 Other specified depressive episodes: Secondary | ICD-10-CM | POA: Diagnosis not present

## 2023-10-07 DIAGNOSIS — F418 Other specified anxiety disorders: Secondary | ICD-10-CM | POA: Diagnosis not present

## 2023-10-14 DIAGNOSIS — F3289 Other specified depressive episodes: Secondary | ICD-10-CM | POA: Diagnosis not present

## 2023-10-14 DIAGNOSIS — F418 Other specified anxiety disorders: Secondary | ICD-10-CM | POA: Diagnosis not present

## 2023-10-18 DIAGNOSIS — F418 Other specified anxiety disorders: Secondary | ICD-10-CM | POA: Diagnosis not present

## 2023-10-18 DIAGNOSIS — F3289 Other specified depressive episodes: Secondary | ICD-10-CM | POA: Diagnosis not present

## 2023-12-16 ENCOUNTER — Telehealth (INDEPENDENT_AMBULATORY_CARE_PROVIDER_SITE_OTHER): Admitting: Family Medicine

## 2023-12-16 ENCOUNTER — Encounter: Payer: Self-pay | Admitting: Family Medicine

## 2023-12-16 DIAGNOSIS — F988 Other specified behavioral and emotional disorders with onset usually occurring in childhood and adolescence: Secondary | ICD-10-CM

## 2023-12-16 DIAGNOSIS — E282 Polycystic ovarian syndrome: Secondary | ICD-10-CM | POA: Diagnosis not present

## 2023-12-16 MED ORDER — AMPHETAMINE-DEXTROAMPHET ER 15 MG PO CP24
15.0000 mg | ORAL_CAPSULE | ORAL | 0 refills | Status: AC
Start: 2023-12-16 — End: 2024-01-15

## 2023-12-16 MED ORDER — AMPHETAMINE-DEXTROAMPHET ER 15 MG PO CP24: 15.0000 mg | ORAL_CAPSULE | ORAL | 0 refills | Status: AC

## 2023-12-16 MED ORDER — METFORMIN HCL 500 MG PO TABS: ORAL_TABLET | ORAL | 3 refills | Status: AC

## 2023-12-16 MED ORDER — AMPHETAMINE-DEXTROAMPHET ER 15 MG PO CP24
15.0000 mg | ORAL_CAPSULE | ORAL | 0 refills | Status: AC
Start: 2024-01-14 — End: 2024-02-13

## 2023-12-16 NOTE — Progress Notes (Signed)
 Virtual Visit via Video Note  I connected with Elizabeth Moon on 12/16/23 at  9:40 AM EST by a video enabled telemedicine application and verified that I am speaking with the correct person using two identifiers.  Location: Patient: Home Provider: Office   I discussed the limitations of evaluation and management by telemedicine and the availability of in person appointments. The patient expressed understanding and agreed to proceed.  History of Present Illness: 43 year old female presents for follow up/medication refills.  PCOS  Stable on Metformin . Needs refill.  ADD Overall stable. She does report that she tries not to take Adderall daily.  Will discuss today.   Observations/Objective: General: well appearing; No acute distress. Respiratory: speaking in full sentences.  Assessment and Plan:  43 year old female presents for follow up.  PCOS stable. Metformin  refilled.  ADD stable. Advised to take medication daily. Medication refilled x 3 months.  Meds ordered this encounter  Medications   metFORMIN  (GLUCOPHAGE ) 500 MG tablet    Sig: TAKE 1 TABLET BY MOUTH EVERY MORNING, AND 1 TABLET EVERY NIGHT AT BEDTIME.    Dispense:  180 tablet    Refill:  3   amphetamine -dextroamphetamine (ADDERALL XR) 15 MG 24 hr capsule    Sig: Take 1 capsule by mouth every morning.    Dispense:  30 capsule    Refill:  0   amphetamine -dextroamphetamine (ADDERALL XR) 15 MG 24 hr capsule    Sig: Take 1 capsule by mouth every morning.    Dispense:  30 capsule    Refill:  0   amphetamine -dextroamphetamine (ADDERALL XR) 15 MG 24 hr capsule    Sig: Take 1 capsule by mouth every morning.    Dispense:  30 capsule    Refill:  0     Follow Up Instructions:    I discussed the assessment and treatment plan with the patient. The patient was provided an opportunity to ask questions and all were answered. The patient agreed with the plan and demonstrated an understanding of the instructions.   The  patient was advised to call back or seek an in-person evaluation if the symptoms worsen or if the condition fails to improve as anticipated.  I provided 5 minutes of non-face-to-face time during this encounter.   Johnston Maddocks G Yanice Maqueda, DO
